# Patient Record
Sex: Male | Born: 1970 | Race: White | Hispanic: No | Marital: Married | Smoking: Never smoker
Health system: Southern US, Community
[De-identification: ages and names within clinical notes are randomized; demographics above are authoritative.]

## PROBLEM LIST (undated history)

## (undated) DIAGNOSIS — N289 Disorder of kidney and ureter, unspecified: Secondary | ICD-10-CM

## (undated) DIAGNOSIS — R011 Cardiac murmur, unspecified: Secondary | ICD-10-CM

## (undated) DIAGNOSIS — K219 Gastro-esophageal reflux disease without esophagitis: Secondary | ICD-10-CM

## (undated) DIAGNOSIS — T7840XA Allergy, unspecified, initial encounter: Secondary | ICD-10-CM

## (undated) HISTORY — PX: OTHER SURGICAL HISTORY: SHX169

## (undated) HISTORY — DX: Gastro-esophageal reflux disease without esophagitis: K21.9

## (undated) HISTORY — DX: Cardiac murmur, unspecified: R01.1

## (undated) HISTORY — DX: Allergy, unspecified, initial encounter: T78.40XA

---

## 1997-09-08 ENCOUNTER — Emergency Department (HOSPITAL_COMMUNITY): Admission: EM | Admit: 1997-09-08 | Discharge: 1997-09-08 | Payer: Self-pay | Admitting: Emergency Medicine

## 1999-11-10 ENCOUNTER — Emergency Department (HOSPITAL_COMMUNITY): Admission: EM | Admit: 1999-11-10 | Discharge: 1999-11-10 | Payer: Self-pay | Admitting: Emergency Medicine

## 1999-11-10 ENCOUNTER — Encounter: Payer: Self-pay | Admitting: Emergency Medicine

## 2001-08-09 ENCOUNTER — Encounter: Payer: Self-pay | Admitting: Gastroenterology

## 2001-08-09 ENCOUNTER — Encounter: Admission: RE | Admit: 2001-08-09 | Discharge: 2001-08-09 | Payer: Self-pay | Admitting: Gastroenterology

## 2004-02-09 HISTORY — PX: OTHER SURGICAL HISTORY: SHX169

## 2004-05-16 ENCOUNTER — Ambulatory Visit (HOSPITAL_COMMUNITY): Admission: RE | Admit: 2004-05-16 | Discharge: 2004-05-16 | Payer: Self-pay | Admitting: Urology

## 2009-09-22 ENCOUNTER — Ambulatory Visit: Payer: Self-pay | Admitting: Cardiovascular Disease

## 2009-09-22 ENCOUNTER — Encounter (INDEPENDENT_AMBULATORY_CARE_PROVIDER_SITE_OTHER): Payer: Self-pay | Admitting: *Deleted

## 2009-09-22 ENCOUNTER — Encounter: Payer: Self-pay | Admitting: Cardiovascular Disease

## 2009-09-22 DIAGNOSIS — R0789 Other chest pain: Secondary | ICD-10-CM | POA: Insufficient documentation

## 2009-09-22 DIAGNOSIS — R011 Cardiac murmur, unspecified: Secondary | ICD-10-CM

## 2009-11-13 ENCOUNTER — Encounter: Payer: Self-pay | Admitting: Cardiovascular Disease

## 2009-11-13 ENCOUNTER — Ambulatory Visit (HOSPITAL_COMMUNITY): Admission: RE | Admit: 2009-11-13 | Discharge: 2009-11-13 | Payer: Self-pay | Admitting: Cardiovascular Disease

## 2009-11-13 ENCOUNTER — Ambulatory Visit: Payer: Self-pay

## 2009-11-13 ENCOUNTER — Ambulatory Visit: Payer: Self-pay | Admitting: Internal Medicine

## 2009-11-13 ENCOUNTER — Ambulatory Visit: Payer: Self-pay | Admitting: Cardiovascular Disease

## 2009-11-13 DIAGNOSIS — I10 Essential (primary) hypertension: Secondary | ICD-10-CM | POA: Insufficient documentation

## 2010-02-19 ENCOUNTER — Ambulatory Visit
Admission: RE | Admit: 2010-02-19 | Discharge: 2010-02-19 | Payer: Self-pay | Source: Home / Self Care | Attending: Cardiovascular Disease | Admitting: Cardiovascular Disease

## 2010-03-10 NOTE — Assessment & Plan Note (Signed)
Summary: PER CHECK OUT/ALSO ECHO @ 10:30/SAF   CC:  follow up echo.  History of Present Illness: 40 yo referred by Dr Sullivan Lone for SSCP.  Pain for 3-6 months.  Atypical can last all day.  Associated with palpitations in a.m mostly.  No CRF's and othewise healthy.  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  Recent divorce with joint custody of two children.  Always exhausted.  Does run without significatn SSCP.  No associated dyspnea or diaphoresis.  No palpitaitons latter in day.  No drugs or stimulants.  ETT done in office last time was normal.  Reviewed echo done today. Normal.  Appears to have borderline HTN with HTN response to exercise.  Encouraged him to het home BP cuff and avoid salt.  Will recheck and review home readings in 3 months  Current Problems (verified): 1)  Cardiac Murmur  (ICD-785.2) 2)  Chest Discomfort  (ICD-786.59)  Current Medications (verified): 1)  Zyrtec Allergy 10 Mg Tabs (Cetirizine Hcl) .Marland Kitchen.. 1 Tab By Mouth Once Daily 2)  Multivitamins   Tabs (Multiple Vitamin) .Marland Kitchen.. 1 Tab By Mouth 3 Days Weekly  Allergies (verified): No Known Drug Allergies  Past History:  Past Medical History: Last updated: 09/22/2009 negative for premature CAD  Past Surgical History: Last updated: 09/17/2009  Left ureteral stone, distal.  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   Vital Signs:  Patient profile:   40 year old male Height:      73 inches Weight:      189 pounds BMI:     25.03 Pulse rate:   63 / minute Resp:     12 per minute BP sitting:    122 / 79  (left arm)  Vitals Entered By: Kem Parkinson (November 13, 2009 11:22 AM)  Physical Exam  General:  Affect appropriate Healthy:  appears stated age HEENT: normal Neck supple with no adenopathy JVP normal no bruits no thyromegaly Lungs clear with no wheezing and good diaphragmatic motion Heart:  S1/S2 no murmur,rub, gallop or click PMI normal Abdomen: benighn, BS positve, no tenderness, no AAA no bruit.  No HSM or HJR Distal pulses intact with no bruits No edema Neuro non-focal Skin warm and dry    Impression & Recommendations:  Problem # 1:  CHEST DISCOMFORT (ICD-786.59) Normal ETT no further w.U  Problem # 2:  CARDIAC MURMUR (ICD-785.2) Not audible today ? flow murmur.  Echo normal  Problem # 5:  HYPERTENSION, BENIGN, TRANSIENT (ICD-401.1) HTN response to exercise over 200 systolic.  Home BP readings and F/U 3 months  No LVH on echo  Patient Instructions: 1)  Your physician recommends that you schedule a follow-up appointment in: 3 MONTHS WITH DR Eden Emms 2)  Your physician recommends that you continue on your current medications as directed. Please refer to the Current Medication list given to you today.

## 2010-03-10 NOTE — Assessment & Plan Note (Signed)
Summary: chest discomfort/mt   CC:  chest pain also rapid heart beat.  History of Present Illness: 40 yo referred by Dr Sullivan Lone for SSCP.  Pain for 3-6 months.  Atypical can last all day.  Associated with palpitations in a.m mostly.  No CRF's and othewise healthy.  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  Recent divorce with joint custody of two children.  Always exhausted.  Does run without significatn SSCP.  No associated dyspnea or diaphoresis.  No palpitaitons latter in day.  No drugs or stimulants.    Current Problems (verified): 1)  Chest Discomfort  (ICD-786.59)  Current Medications (verified): 1)  Zyrtec Allergy 10 Mg Tabs (Cetirizine Hcl) .Marland Kitchen.. 1 Tab By Mouth Once Daily 2)  Multivitamins   Tabs (Multiple Vitamin) .Marland Kitchen.. 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   Vital Signs:  Patient profile:   40 year old male Height:      73 inches Weight:      189 pounds BMI:     25.03 Pulse rate:   64 / minute Resp:     12 per minute BP sitting:   118 / 70  (left arm)  Vitals Entered By: Kem Parkinson (September 22, 2009 2:57 PM)  Physical Exam  General:  Affect appropriate Healthy:  appears stated age HEENT: normal Neck supple with no adenopathy JVP normal no bruits no thyromegaly Lungs clear with no wheezing and good diaphragmatic motion Heart:  S1/S2 no murmur,rub, gallop or click PMI normal Abdomen: benighn, BS positve, no tenderness, no AAA no bruit.   No HSM or HJR Distal pulses intact with no bruits No edema Neuro non-focal Skin warm and dry    Impression & Recommendations:  Problem # 1:  CHEST DISCOMFORT (ICD-786.59) Atypical with normal ECG.  Will do ETT today No imaging needed.   EKG Report  Procedure date:  09/22/2009  Findings:      NSR Normal ECG

## 2010-03-10 NOTE — Miscellaneous (Signed)
Summary: Orders Update  Clinical Lists Changes  Problems: Added new problem of CARDIAC MURMUR (ICD-785.2) Orders: Added new Referral order of Echocardiogram (Echo) - Signed

## 2010-03-12 NOTE — Assessment & Plan Note (Signed)
Summary: 3 MONTH ROV/SL   CC:  check up.  History of Present Illness: Phillip Choi is seen today for F/U of SSCP, borderline HTN.  Echo and ETT were normal.  He is training for a half marathon in [place] with no problems.  BP readings at home have been ok with rare readings above .  No recurrent SSCP, dyspnea, palpiatoins or syncope   Current Problems (verified): 1)  Hypertension, Benign, Transient  (ICD-401.1) 2)  Cardiac Murmur  (ICD-785.2) 3)  Chest Discomfort  (ICD-786.59)  Current Medications (verified): 1)  Zyrtec Allergy 10 Mg Tabs (Cetirizine Hcl) .Marland Kitchen.. 1 Tab By Mouth Once Daily 2)  Multivitamins   Tabs (Multiple Vitamin) .Marland Kitchen.. 1 Tab By Mouth 3 Days Weekly  Allergies (verified): No Known Drug Allergies  Past History:  Past Medical History: Last updated: 09/22/2009 negative for premature CAD  Past Surgical History: Last updated: 09/17/2009  Left ureteral stone, distal.  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   Vital Signs:  Patient profile:   40 year old male Height:      73 inches Weight:      194 pounds BMI:     25.69 Pulse rate:   68 / minute Resp:     14 per minute BP sitting:   130 / 70  (left arm)  Vitals Entered By: Kem Parkinson (February 19, 2010 8:16 AM)  Physical Exam  General:  Affect appropriate Healthy:  appears stated age HEENT: normal Neck supple with no adenopathy JVP normal no bruits no thyromegaly Lungs clear with no wheezing and good diaphragmatic motion Heart:  S1/S2 no murmur,rub, gallop or click PMI normal Abdomen: benighn, BS  positve, no tenderness, no AAA no bruit.  No HSM or HJR Distal pulses intact with no bruits No edema Neuro non-focal Skin warm and dry    Impression & Recommendations:  Problem # 1:  HYPERTENSION, BENIGN, TRANSIENT (ICD-401.1) Continiue exercise and low sodium diet.  No indicatation for medicatin at this time  Problem # 2:  CHEST DISCOMFORT (ICD-786.59) Normal echo and ETT  Active and running Observe

## 2010-06-26 NOTE — Op Note (Signed)
NAME:  Piehl, Sajan            ACCOUNT NO.:  [id number]   MEDICAL RECORD NO.:  [id number]          PATIENT TYPE:  AMB   LOCATION:  DAY                          FACILITY:  [facility]   PHYSICIAN:  Rozanna Boer., M.D.DATE OF BIRTH:  1970-11-21   DATE OF PROCEDURE:  05/16/2004  DATE OF DISCHARGE:                                 OPERATIVE REPORT   PREOPERATIVE DIAGNOSIS:  Left ureteral stone, distal.   POSTOPERATIVE DIAGNOSIS:  Left ureteral stone, distal.   OPERATION:  1.  Cysto.  2.  Left retrograde pyelogram.  3.  Ureteroscopy with stone removal.   ANESTHESIA:  General.   SURGEON:  Dr. Aldean Ast   This 40 year old patient is admitted for unrelieved pain because of a distal  left ureteral stone.  This is about 5-6 mm in size.  He had his first  episode of pain last November when he had a 4-5 mm left ureteral stone about  L1 associated with hydronephrosis on the left side and no other renal stones  seen at that time.  He had not had previous stones or hematuria.  His pain  went away but came back again later last month, and now the stone has  progressed down to the left distal ureteral orifice, and he is having  unrelieved pain and nausea and vomiting.  He enters now for ureteroscopy,  possible holmium laser treatment of the stone.   The patient was placed on the operating table in dorsolithotomy position.  After satisfactory induction of general anesthesia, was prepped and draped  with Betadine in the usual sterile fashion.  A #22 panendoscope was  inserted, no anterior stricture seen.  Short and nonobstructing prostate was  noted.  The bladder was entered and carefully inspected.  No bladder mucosal  lesions were seen.  Both orifices looked normal.   A 5 open-ended ureteral catheter was inserted in the left ureteral orifice,  and a retrograde occlusive pyelogram was performed.  This demonstrated a  filling defect in the distal left ureter corresponding to the  calcification  seen on the KUB preoperatively.  He did have some mild hydronephrosis  proximal to this.  There were no other filling defects seen within the  ureter.   A 0.038 floppy tip guidewire was then passed through the open-ended catheter  under fluoroscopy past the stone up into the level of the kidney as the open-  ended catheter was then removed.  Over the guidewire, a 4 cm ureteral  balloon dilator was then passed and under fluoroscopy the end of the balloon  was passed right up to where the stone and filling defect were seen.  This  was inflated to 12 atmospheres and kept maintained for 3 timed minutes.  When this was done, the balloon was deflated and removed, leaving the  guidewire in place.  A 6 short ureteroscope was then passed through the  urethra, up the ureter, to the stone.  I was able to negotiate past the  stone a short distance and then used a basket to entrap and remove the stone  intact.  The stone was fairly large,  and actually there was a small one with  it.  Another view of the bladder with the cystoscope showed that the  ureteral orifice was fairly well  dilated, and there was very minimal bleeding, so I decided not to leave a  stent.  I drained the bladder, removed the scope and instruments, and gave  the patient 30 mg of Toradol IV and B&O suppository and was taken to the  recovery room in good condition where he will be later discharged as an  outpatient.      HMK/MEDQ  D:  05/16/2004  T:  05/16/2004  Job:  [id number]

## 2012-03-16 ENCOUNTER — Emergency Department (HOSPITAL_COMMUNITY): Payer: 59

## 2012-03-16 ENCOUNTER — Ambulatory Visit
Admission: RE | Admit: 2012-03-16 | Discharge: 2012-03-16 | Disposition: A | Discharge status: Home / Self Care | Payer: 59 | Source: Ambulatory Visit | Attending: Gastroenterology | Admitting: Gastroenterology

## 2012-03-16 ENCOUNTER — Encounter (HOSPITAL_COMMUNITY): Payer: Self-pay | Admitting: *Deleted

## 2012-03-16 ENCOUNTER — Other Ambulatory Visit: Payer: Self-pay | Admitting: Gastroenterology

## 2012-03-16 ENCOUNTER — Inpatient Hospital Stay (HOSPITAL_COMMUNITY)
Admission: EM | Admit: 2012-03-16 | Discharge: 2012-03-18 | DRG: 337 | Disposition: A | Discharge status: Home / Self Care | Payer: 59 | Attending: General Surgery | Admitting: General Surgery

## 2012-03-16 DIAGNOSIS — R109 Unspecified abdominal pain: Secondary | ICD-10-CM

## 2012-03-16 DIAGNOSIS — Q430 Meckel's diverticulum (displaced) (hypertrophic): Secondary | ICD-10-CM

## 2012-03-16 DIAGNOSIS — K4090 Unilateral inguinal hernia, without obstruction or gangrene, not specified as recurrent: Secondary | ICD-10-CM | POA: Diagnosis present

## 2012-03-16 DIAGNOSIS — I10 Essential (primary) hypertension: Secondary | ICD-10-CM | POA: Diagnosis present

## 2012-03-16 DIAGNOSIS — R011 Cardiac murmur, unspecified: Secondary | ICD-10-CM | POA: Diagnosis present

## 2012-03-16 DIAGNOSIS — K56609 Unspecified intestinal obstruction, unspecified as to partial versus complete obstruction: Secondary | ICD-10-CM | POA: Diagnosis present

## 2012-03-16 DIAGNOSIS — K56600 Partial intestinal obstruction, unspecified as to cause: Secondary | ICD-10-CM

## 2012-03-16 DIAGNOSIS — R030 Elevated blood-pressure reading, without diagnosis of hypertension: Secondary | ICD-10-CM | POA: Diagnosis present

## 2012-03-16 DIAGNOSIS — R12 Heartburn: Secondary | ICD-10-CM | POA: Diagnosis present

## 2012-03-16 DIAGNOSIS — E860 Dehydration: Secondary | ICD-10-CM | POA: Diagnosis present

## 2012-03-16 DIAGNOSIS — Z79899 Other long term (current) drug therapy: Secondary | ICD-10-CM

## 2012-03-16 DIAGNOSIS — K565 Intestinal adhesions [bands], unspecified as to partial versus complete obstruction: Principal | ICD-10-CM | POA: Diagnosis present

## 2012-03-16 HISTORY — DX: Disorder of kidney and ureter, unspecified: N28.9

## 2012-03-16 LAB — COMPREHENSIVE METABOLIC PANEL
AST: 20 U/L (ref 0–37)
Albumin: 4.8 g/dL (ref 3.5–5.2)
BUN: 17 mg/dL (ref 6–23)
Calcium: 10.3 mg/dL (ref 8.4–10.5)
Creatinine, Ser: 1.03 mg/dL (ref 0.50–1.35)
Total Bilirubin: 0.6 mg/dL (ref 0.3–1.2)

## 2012-03-16 LAB — CBC WITH DIFFERENTIAL/PLATELET
Basophils Absolute: 0 10*3/uL (ref 0.0–0.1)
Basophils Relative: 0 % (ref 0–1)
Eosinophils Absolute: 0 10*3/uL (ref 0.0–0.7)
Eosinophils Relative: 0 % (ref 0–5)
HCT: 47.2 % (ref 39.0–52.0)
Hemoglobin: 16.8 g/dL (ref 13.0–17.0)
MCH: 32.1 pg (ref 26.0–34.0)
MCHC: 35.6 g/dL (ref 30.0–36.0)
MCV: 90.2 fL (ref 78.0–100.0)
Monocytes Absolute: 1.1 10*3/uL — ABNORMAL HIGH (ref 0.1–1.0)
Monocytes Relative: 8 % (ref 3–12)
RDW: 11.4 % — ABNORMAL LOW (ref 11.5–15.5)

## 2012-03-16 MED ORDER — ALUM & MAG HYDROXIDE-SIMETH 200-200-20 MG/5ML PO SUSP: 30.0000 mL | Freq: Four times a day (QID) | ORAL | Status: DC | PRN

## 2012-03-16 MED ORDER — IOHEXOL 300 MG/ML  SOLN
50.0000 mL | Freq: Once | INTRAMUSCULAR | Status: AC | PRN
  Administered 2012-03-16: 50 mL via ORAL

## 2012-03-16 MED ORDER — LIDOCAINE HCL 2 % EX GEL
CUTANEOUS | Status: AC
  Administered 2012-03-16: 23:00:00
  Filled 2012-03-16: qty 10

## 2012-03-16 MED ORDER — HYDROMORPHONE HCL PF 1 MG/ML IJ SOLN
0.5000 mg | INTRAMUSCULAR | Status: DC | PRN
  Administered 2012-03-17: 1 mg via INTRAVENOUS
  Filled 2012-03-16: qty 1

## 2012-03-16 MED ORDER — SODIUM CHLORIDE 0.9 % IV BOLUS (SEPSIS)
1000.0000 mL | Freq: Once | INTRAVENOUS | Status: AC
  Administered 2012-03-16: 1000 mL via INTRAVENOUS

## 2012-03-16 MED ORDER — ACETAMINOPHEN 650 MG RE SUPP: 650.0000 mg | Freq: Four times a day (QID) | RECTAL | Status: DC | PRN

## 2012-03-16 MED ORDER — MENTHOL 3 MG MT LOZG: 1.0000 | LOZENGE | OROMUCOSAL | Status: DC | PRN

## 2012-03-16 MED ORDER — PHENOL 1.4 % MT LIQD: 2.0000 | OROMUCOSAL | Status: DC | PRN

## 2012-03-16 MED ORDER — HEPARIN SODIUM (PORCINE) 5000 UNIT/ML IJ SOLN
5000.0000 [IU] | Freq: Three times a day (TID) | INTRAMUSCULAR | Status: DC
  Administered 2012-03-16 – 2012-03-18 (×4): 5000 [IU] via SUBCUTANEOUS
  Filled 2012-03-16 (×8): qty 1

## 2012-03-16 MED ORDER — METOPROLOL TARTRATE 1 MG/ML IV SOLN
5.0000 mg | Freq: Four times a day (QID) | INTRAVENOUS | Status: DC | PRN
  Filled 2012-03-16: qty 5

## 2012-03-16 MED ORDER — IOHEXOL 300 MG/ML  SOLN
100.0000 mL | Freq: Once | INTRAMUSCULAR | Status: AC | PRN
  Administered 2012-03-16: 100 mL via INTRAVENOUS

## 2012-03-16 MED ORDER — LACTATED RINGERS IV BOLUS (SEPSIS)
2000.0000 mL | Freq: Once | INTRAVENOUS | Status: AC
  Administered 2012-03-16: 2000 mL via INTRAVENOUS

## 2012-03-16 MED ORDER — BISACODYL 10 MG RE SUPP: 10.0000 mg | Freq: Two times a day (BID) | RECTAL | Status: DC | PRN

## 2012-03-16 MED ORDER — ONDANSETRON HCL 4 MG/2ML IJ SOLN
4.0000 mg | Freq: Once | INTRAMUSCULAR | Status: AC
Start: 2012-03-16 — End: 2012-03-16
  Administered 2012-03-16: 4 mg via INTRAVENOUS
  Filled 2012-03-16: qty 2

## 2012-03-16 MED ORDER — MAGIC MOUTHWASH
15.0000 mL | Freq: Four times a day (QID) | ORAL | Status: DC | PRN
  Filled 2012-03-16: qty 15

## 2012-03-16 MED ORDER — FAMOTIDINE IN NACL 20-0.9 MG/50ML-% IV SOLN
20.0000 mg | Freq: Two times a day (BID) | INTRAVENOUS | Status: DC
  Administered 2012-03-17 – 2012-03-18 (×3): 20 mg via INTRAVENOUS
  Filled 2012-03-16 (×5): qty 50

## 2012-03-16 MED ORDER — ONDANSETRON HCL 4 MG/2ML IJ SOLN
4.0000 mg | Freq: Once | INTRAMUSCULAR | Status: AC
  Administered 2012-03-16: 4 mg via INTRAVENOUS
  Filled 2012-03-16: qty 2

## 2012-03-16 MED ORDER — PROMETHAZINE HCL 25 MG/ML IJ SOLN
12.5000 mg | Freq: Four times a day (QID) | INTRAMUSCULAR | Status: DC | PRN
  Filled 2012-03-16: qty 1

## 2012-03-16 MED ORDER — DIPHENHYDRAMINE HCL 50 MG/ML IJ SOLN: 12.5000 mg | Freq: Four times a day (QID) | INTRAMUSCULAR | Status: DC | PRN

## 2012-03-16 MED ORDER — LIP MEDEX EX OINT
1.0000 "application " | TOPICAL_OINTMENT | Freq: Two times a day (BID) | CUTANEOUS | Status: DC
  Administered 2012-03-17 – 2012-03-18 (×3): 1 via TOPICAL
  Filled 2012-03-16: qty 7

## 2012-03-16 MED ORDER — LACTATED RINGERS IV BOLUS (SEPSIS)
1000.0000 mL | Freq: Three times a day (TID) | INTRAVENOUS | Status: DC | PRN
  Administered 2012-03-17: 1000 mL via INTRAVENOUS

## 2012-03-16 MED ORDER — ACETAMINOPHEN 325 MG PO TABS: 650.0000 mg | ORAL_TABLET | Freq: Four times a day (QID) | ORAL | Status: DC | PRN

## 2012-03-16 MED ORDER — ONDANSETRON HCL 4 MG/2ML IJ SOLN: 4.0000 mg | Freq: Four times a day (QID) | INTRAMUSCULAR | Status: DC | PRN

## 2012-03-16 MED ORDER — HYDROMORPHONE HCL PF 1 MG/ML IJ SOLN
0.5000 mg | Freq: Once | INTRAMUSCULAR | Status: AC
  Administered 2012-03-16: 0.5 mg via INTRAVENOUS
  Filled 2012-03-16: qty 1

## 2012-03-16 MED ORDER — KCL IN DEXTROSE-NACL 40-5-0.9 MEQ/L-%-% IV SOLN
INTRAVENOUS | Status: DC
  Administered 2012-03-17 (×2): via INTRAVENOUS
  Filled 2012-03-16 (×4): qty 1000

## 2012-03-16 MED ORDER — DIPHENHYDRAMINE HCL 12.5 MG/5ML PO ELIX: 12.5000 mg | ORAL_SOLUTION | Freq: Four times a day (QID) | ORAL | Status: DC | PRN

## 2012-03-16 NOTE — ED Notes (Signed)
Surgeon at bedside.  

## 2012-03-16 NOTE — ED Notes (Signed)
Hospitalist at bedside 

## 2012-03-16 NOTE — ED Provider Notes (Signed)
History     CSN: [id number]  Arrival date & time 03/16/12  1722   First MD Initiated Contact with Patient 03/16/12 1817      Chief Complaint  Patient presents with  . small bowel obstruction     (Consider location/radiation/quality/duration/timing/severity/associated sxs/prior treatment) HPI  Phillip Choi is a 42 y.o. male complaining of subjective fever and moderate to severe abdominal pain onset last night.  Patient states he is not passing gas.  He stated he has had 2-3 episodes of watery diarrhea this after you to noon with to 3 episodes of nonbloody nonbilious, not coffee-ground emesis this afternoon as well.  He said he's had approximately 10 similar episodes in the past 15 years he seen a gastroenterologist, Dr. Randa Evens working at [place].  Who advised him to present to the emergency room during symptom exacerbation so it can be done at that time.  Patient denies any chest pain, shortness of breath, dizzy sensation change in bladder habits, blood in stool or emesis.  No prior abdominal surgeries.   Past Medical History  Diagnosis Date  . Renal disorder     hx of kidney stone    Past Surgical History  Procedure Date  . Gum graft     Feb 2014    History reviewed. No pertinent family history.  History  Substance Use Topics  . Smoking status: Never Smoker   . Smokeless tobacco: Not on file  . Alcohol Use: Yes     Comment: 12 drinks/ week      Review of Systems  Constitutional: Negative for fever.  Respiratory: Negative for shortness of breath.   Cardiovascular: Negative for chest pain.  Gastrointestinal: Positive for nausea, vomiting, abdominal pain, diarrhea and abdominal distention.  All other systems reviewed and are negative.    Allergies  Review of patient's allergies indicates no known allergies.  Home Medications   Current Outpatient Rx  Name  Route  Sig  Dispense  Refill  . NAPROXEN 250 MG PO TABS   Oral   Take 250 mg by mouth as needed. PAIN         . ONDANSETRON HCL 4 MG PO TABS   Oral   Take 4 mg by mouth every 8 (eight) hours as needed. NAUSEA         . PENICILLIN V POTASSIUM 500 MG PO TABS   Oral   Take 500 mg by mouth 3 (three) times daily.         Marland Kitchen PSEUDOEPHEDRINE-GUAIFENESIN ER 60-600 MG PO TB12   Oral   Take 1 tablet by mouth as needed. CONGESTION           BP 124/88  Pulse 77  Temp 97.8 F (36.6 C) (Oral)  Resp 16  SpO2 100%  Physical Exam  Nursing note and vitals reviewed. Constitutional: He is oriented to person, place, and time. He appears well-developed and well-nourished. No distress.  HENT:  Head: Normocephalic.  Mouth/Throat: Oropharynx is clear and moist.  Eyes: Conjunctivae normal and EOM are normal. Pupils are equal, round, and reactive to light.  Neck: Normal range of motion.  Cardiovascular: Normal rate, regular rhythm and intact distal pulses.   Pulmonary/Chest: Effort normal and breath sounds normal. No stridor. No respiratory distress. He has no wheezes. He has no rales. He exhibits no tenderness.  Abdominal: Soft. He exhibits no distension and no mass. There is no tenderness. There is no rebound and no guarding.       Mild distention.  Upper quadrant bowel sounds are hyperactive.  Diffusely tender to deep palpation.  No guarding or rebound.  Musculoskeletal: Normal range of motion.  Neurological: He is alert and oriented to person, place, and time.  Psychiatric: He has a normal mood and affect.    ED Course  Procedures (including critical care time)  Labs Reviewed - No data to display Dg Abd 2 Views  03/16/2012  *RADIOLOGY REPORT*  Clinical Data: Abdominal pain, nausea and vomiting  ABDOMEN - 2 VIEW  Comparison: 05/16/2004; abdominal CT - 05/13/2004  Findings:  There is moderate gaseous distension of multiple loops of small bowel with index loop within the right upper abdominal quadrant measuring approximately 4.6 cm in diameter.  This finding is associated with several air  fluid levels of differing heights on the  provided upright abdominal radiograph.  There is a conspicuous paucity of distal colonic gas.  No definite pneumoperitoneum, pneumatosis or portal venous gas.  No definite abnormal intra-abdominal calcifications.  Limited visualization lower thorax normal.  No acute osseous abnormalities.  IMPRESSION: Findings worrisome for small bowel obstruction.  Further evaluation of abdominal CT may be performed as clinically indicated.   Original Report Authenticated By: Tacey Ruiz, MD      No diagnosis found.    MDM  Patient with no risk factors for small bowel obstruction percent in with abdominal pain, distention, nausea vomiting and several episodes of diarrhea this afternoon.  X-ray is very concerning for SBO with distended loops in the right upper quadrant and air fluid levels.  Patient is a mild leukocytosis of 12.5.  Case signed out to PA Greene at shift change.        Wynetta Emery, PA-C 03/16/12 2018

## 2012-03-16 NOTE — ED Notes (Signed)
Unable to insert 18 Fr NG tube for pt care. 2nd RN to try and unable. Reattempted insertion with 16 Fr and 14 Fr and unsuccessful.

## 2012-03-16 NOTE — ED Provider Notes (Signed)
Pt signed out to me by Wynetta Emery, PA-C.    Concerns for SBO. CT scan shows partial small bowel. Pt not actively vomiting--- [facility] has agreed to admit but requests that surgery consult on patient. Will call and request GenSurg consult.  Results for orders placed during the hospital encounter of 03/16/12  CBC WITH DIFFERENTIAL      Component Value Range   WBC 12.5 (*) 4.0 - 10.5 K/uL   RBC 5.23  4.22 - 5.81 MIL/uL   Hemoglobin 16.8  13.0 - 17.0 g/dL   HCT 16.1  09.6 - 04.5 %   MCV 90.2  78.0 - 100.0 fL   MCH 32.1  26.0 - 34.0 pg   MCHC 35.6  30.0 - 36.0 g/dL   RDW 40.9 (*) 81.1 - 91.4 %   Platelets 284  150 - 400 K/uL   Neutrophils Relative 82 (*) 43 - 77 %   Neutro Abs 10.3 (*) 1.7 - 7.7 K/uL   Lymphocytes Relative 9 (*) 12 - 46 %   Lymphs Abs 1.1  0.7 - 4.0 K/uL   Monocytes Relative 8  3 - 12 %   Monocytes Absolute 1.1 (*) 0.1 - 1.0 K/uL   Eosinophils Relative 0  0 - 5 %   Eosinophils Absolute 0.0  0.0 - 0.7 K/uL   Basophils Relative 0  0 - 1 %   Basophils Absolute 0.0  0.0 - 0.1 K/uL  COMPREHENSIVE METABOLIC PANEL      Component Value Range   Sodium 136  135 - 145 mEq/L   Potassium 4.0  3.5 - 5.1 mEq/L   Chloride 97  96 - 112 mEq/L   CO2 25  19 - 32 mEq/L   Glucose, Bld 106 (*) 70 - 99 mg/dL   BUN 17  6 - 23 mg/dL   Creatinine, Ser 7.82  0.50 - 1.35 mg/dL   Calcium 95.6  8.4 - 21.3 mg/dL   Total Protein 9.1 (*) 6.0 - 8.3 g/dL   Albumin 4.8  3.5 - 5.2 g/dL   AST 20  0 - 37 U/L   ALT 17  0 - 53 U/L   Alkaline Phosphatase 91  39 - 117 U/L   Total Bilirubin 0.6  0.3 - 1.2 mg/dL   GFR calc non Af Amer 90 (*) >90 mL/min   GFR calc Af Amer >90  >90 mL/min   Ct Abdomen Pelvis W Contrast  03/16/2012  *RADIOLOGY REPORT*  Clinical Data: Abdominal pain for 2 days with vomiting today. Current radiographs show evidence of a small bowel obstruction.  CT ABDOMEN AND PELVIS WITH CONTRAST  Technique:  Multidetector CT imaging of the abdomen and pelvis was performed following the  standard protocol during bolus administration of intravenous contrast.  Contrast: 50mL OMNIPAQUE IOHEXOL 300 MG/ML  SOLN, OMNIPAQUE IOHEXOL 300 MG/ML  SOLN  Comparison: Current abdomen radiographs and prior abdomen CT, 05/13/2004  Findings: There are findings of a partial small bowel obstruction. The transition point appears to be in the right upper quadrant in the proximal to mid ileum.  The jejunum is dilated diffusely with a maximal dilation measured at 4 cm.  There is no bowel wall dilation.  There is no mesenteric inflammation.  The colon is mostly decompressed.  A normal appendix is visualized.  The liver, spleen, gallbladder and pancreas are unremarkable. There is no bile duct dilation.  Normal adrenal glands. There is a 1 ml nonobstructing stone in the lower pole of the left  kidney. Kidneys are otherwise unremarkable.  Normal ureters.  Normal bladder.  No pathologically enlarged lymph nodes.  No abnormal fluid collections.  There are mild degenerative changes of the visualized spine most prominent at L5-S1.  No osteoblastic or osteolytic lesions.  IMPRESSION: Partial small bowel obstruction with the transition point in the right upper quadrant.  No evidence of bowel inflammation or ischemia.  No other acute findings.   Original Report Authenticated By: Amie Portland, M.D.    Dg Abd 2 Views  03/16/2012  *RADIOLOGY REPORT*  Clinical Data: Abdominal pain, nausea and vomiting  ABDOMEN - 2 VIEW  Comparison: 05/16/2004; abdominal CT - 05/13/2004  Findings:  There is moderate gaseous distension of multiple loops of small bowel with index loop within the right upper abdominal quadrant measuring approximately 4.6 cm in diameter.  This finding is associated with several air fluid levels of differing heights on the  provided upright abdominal radiograph.  There is a conspicuous paucity of distal colonic gas.  No definite pneumoperitoneum, pneumatosis or portal venous gas.  No definite abnormal intra-abdominal  calcifications.  Limited visualization lower thorax normal.  No acute osseous abnormalities.  IMPRESSION: Findings worrisome for small bowel obstruction.  Further evaluation of abdominal CT may be performed as clinically indicated.   Original Report Authenticated By: Tacey Ruiz, MD    ' Dr. Michaell Cowing with General Surgery to admit  Dx: partial small bowel obstruction  Dorthula Matas, PA 03/16/12 2107  Dorthula Matas, PA 03/16/12 2123

## 2012-03-16 NOTE — ED Notes (Signed)
PA at bedside.

## 2012-03-16 NOTE — H&P (Signed)
Phillip Choi  [birth year] [id number]   This patient is a 42 y.o.male who presents today for surgical evaluation at the request of Dr. Meyer Russel GI, and Dr Verne Spurr of [facility] emergency department.   Reason for evaluation: Small bowel obstruction.  Pleasant active male.  History of intermittent nausea and vomiting and abdominal pain.  Workup negative in the past although not been too comprehensive yet.  Usually presents to his gastroenterologist, Dr. Randa Evens, after the symptoms have abated.   Underwent health examination at [facility].  Discussion was made about considering colonoscopy.    Patient developed severe abdominal pain yesterday.  Progressed with nausea and vomiting.  Feels very distended.  Called his gastroenterologist.  Dr. Randa Evens recommended urgent CT scan to see if he had evidence of obstruction or other abnormality during symptoms.  No personal nor family history of GI/colon cancer, inflammatory bowel disease, irritable bowel syndrome, allergy such as Celiac Sprue, dietary/dairy problems, colitis, ulcers nor gastritis.  No recent sick contacts/gastroenteritis.  No travel outside the country.  No changes in diet.  He comes today with a male friend.He does have mild heartburn which is easily controlled with a Pepcid.  This does not feel like this.  No dysphagia to solids nor liquids.  Rare alcohol intake.  No hepatitis or pancreatitis.  No sprain/trauama.  No postprandial nausea vomiting/biliary colic.  No episodes of jaundice.  No history of trauma or fall    Past Medical History  Diagnosis Date  . Renal disorder     hx of kidney stone    Past Surgical History  Procedure Date  . Gum graft     Feb 2014  . Ureteroscopy with stone removal 2006    Dr. Aldean Ast: [facility]    History   Social History  . Marital Status: Single    Spouse Name: N/A    Number of Children: N/A  . Years of Education: N/A   Occupational History  . Not on  file.   Social History Main Topics  . Smoking status: Never Smoker   . Smokeless tobacco: Not on file  . Alcohol Use: Yes     Comment: 12 drinks/ week  . Drug Use: No  . Sexually Active:    Other Topics Concern  . Not on file   Social History Narrative  . No narrative on file    History reviewed. No pertinent family history.  Current Facility-Administered Medications  Medication Dose Route Frequency Provider Last Rate Last Dose  . acetaminophen (TYLENOL) tablet 650 mg  650 mg Oral Q6H PRN Ardeth Sportsman, MD       Or  . acetaminophen (TYLENOL) suppository 650 mg  650 mg Rectal Q6H PRN Ardeth Sportsman, MD      . alum & mag hydroxide-simeth (MAALOX/MYLANTA) 200-200-20 MG/5ML suspension 30 mL  30 mL Oral Q6H PRN Ardeth Sportsman, MD      . bisacodyl (DULCOLAX) suppository 10 mg  10 mg Rectal Q12H PRN Ardeth Sportsman, MD      . dextrose 5 % and 0.9 % NaCl with KCl 40 mEq/L infusion   Intravenous Continuous Ardeth Sportsman, MD      . diphenhydrAMINE (BENADRYL) injection 12.5-25 mg  12.5-25 mg Intravenous Q6H PRN Ardeth Sportsman, MD       Or  . diphenhydrAMINE (BENADRYL) 12.5 MG/5ML elixir 12.5-25 mg  12.5-25 mg Oral Q6H PRN Ardeth Sportsman, MD      . heparin injection 5,000  Units  5,000 Units Subcutaneous Q8H Ardeth Sportsman, MD      . HYDROmorphone (DILAUDID) injection 0.5-2 mg  0.5-2 mg Intravenous Q2H PRN Ardeth Sportsman, MD      . lactated ringers bolus 1,000 mL  1,000 mL Intravenous Q8H PRN Ardeth Sportsman, MD      . lactated ringers bolus 2,000 mL  2,000 mL Intravenous Once Ardeth Sportsman, MD      . lip balm (CARMEX) ointment 1 application  1 application Topical BID Ardeth Sportsman, MD      . magic mouthwash  15 mL Oral QID PRN Ardeth Sportsman, MD      . menthol-cetylpyridinium (CEPACOL) lozenge 3 mg  1 lozenge Oral PRN Ardeth Sportsman, MD      . metoprolol (LOPRESSOR) injection 5 mg  5 mg Intravenous Q6H PRN Ardeth Sportsman, MD      . ondansetron (ZOFRAN) injection 4 mg  4 mg  Intravenous Q6H PRN Ardeth Sportsman, MD      . phenol (CHLORASEPTIC) mouth spray 2 spray  2 spray Mouth/Throat PRN Ardeth Sportsman, MD      . promethazine (PHENERGAN) injection 12.5-25 mg  12.5-25 mg Intravenous Q6H PRN Ardeth Sportsman, MD       Current Outpatient Prescriptions  Medication Sig Dispense Refill  . naproxen (NAPROSYN) 250 MG tablet Take 250 mg by mouth as needed. PAIN      . ondansetron (ZOFRAN) 4 MG tablet Take 4 mg by mouth every 8 (eight) hours as needed. NAUSEA      . penicillin v potassium (VEETID) 500 MG tablet Take 500 mg by mouth 3 (three) times daily.      . pseudoephedrine-guaifenesin (MUCINEX D) 60-600 MG per tablet Take 1 tablet by mouth as needed. CONGESTION         No Known Allergies  ROS: Constitutional:  No fevers, chills, sweats.  Weight stable Eyes:  No vision changes, No discharge HENT:  No sore throats, nasal drainage Lymph: No neck swelling, No bruising easily Pulmonary:  No cough, productive sputum CV: No orthopnea, PND  Patient walks 3 hours minutes for about 10 miles without difficulty.  No exertional chest/neck/shoulder/arm pain. GI: No personal nor family history of GI/colon cancer, inflammatory bowel disease, irritable bowel syndrome, allergy such as Celiac Sprue, dietary/dairy problems, colitis, ulcers nor gastritis.  No recent sick contacts/gastroenteritis.  No travel outside the country.  No changes in diet. Renal: No UTIs, No hematuria Genital:  No drainage, bleeding, masses Musculoskeletal: No severe joint pain.  Good ROM major joints Skin:  No sores or lesions.  No rashes Heme/Lymph:  No easy bleeding.  No swollen lymph nodes Neuro: No focal weakness/numbness.  No seizures Psych: No suicidal ideation.  No hallucinations  BP 124/88  Pulse 77  Temp 97.8 F (36.6 C) (Oral)  Resp 16  SpO2 100%  Physical Exam: General: Pt awake/alert/oriented x4 in mild acute distress Eyes: PERRL, normal EOM. Sclera nonicteric Neuro: CN II-XII intact  w/o focal sensory/motor deficits. Lymph: No head/neck/groin lymphadenopathy Psych:  No delerium/psychosis/paranoia HENT: Normocephalic, Mucus membranes moist.  No thrush Neck: Supple, No tracheal deviation Chest: No pain.  Good respiratory excursion. CV:  Pulses intact.  Regular rhythm Abdomen: Soft, Moderately distended.  Mildly tender.  No guarding, rebound tenderness, Murphy's sign.  No tenderness at McBurney's point.  No umbilical hernia.  No abdominal incisions GU:  Normal external male genitalia.  Mild laxity in both groins but no definite  hernias.   Ext:  SCDs BLE.  No significant edema.  No cyanosis Skin: No petechiae / purpurea.  No major sores Musculoskeletal: No severe joint pain.  Good ROM major joints   Results:   Labs: Results for orders placed during the hospital encounter of 03/16/12 (from the past 48 hour(s))  CBC WITH DIFFERENTIAL     Status: Abnormal   Collection Time   03/16/12  6:51 PM      Component Value Range Comment   WBC 12.5 (*) 4.0 - 10.5 K/uL    RBC 5.23  4.22 - 5.81 MIL/uL    Hemoglobin 16.8  13.0 - 17.0 g/dL    HCT 16.1  09.6 - 04.5 %    MCV 90.2  78.0 - 100.0 fL    MCH 32.1  26.0 - 34.0 pg    MCHC 35.6  30.0 - 36.0 g/dL    RDW 40.9 (*) 81.1 - 15.5 %    Platelets 284  150 - 400 K/uL    Neutrophils Relative 82 (*) 43 - 77 %    Neutro Abs 10.3 (*) 1.7 - 7.7 K/uL    Lymphocytes Relative 9 (*) 12 - 46 %    Lymphs Abs 1.1  0.7 - 4.0 K/uL    Monocytes Relative 8  3 - 12 %    Monocytes Absolute 1.1 (*) 0.1 - 1.0 K/uL    Eosinophils Relative 0  0 - 5 %    Eosinophils Absolute 0.0  0.0 - 0.7 K/uL    Basophils Relative 0  0 - 1 %    Basophils Absolute 0.0  0.0 - 0.1 K/uL   COMPREHENSIVE METABOLIC PANEL     Status: Abnormal   Collection Time   03/16/12  6:51 PM      Component Value Range Comment   Sodium 136  135 - 145 mEq/L    Potassium 4.0  3.5 - 5.1 mEq/L    Chloride 97  96 - 112 mEq/L    CO2 25  19 - 32 mEq/L    Glucose, Bld 106 (*) 70 - 99 mg/dL     BUN 17  6 - 23 mg/dL    Creatinine, Ser 9.14  0.50 - 1.35 mg/dL    Calcium 78.2  8.4 - 10.5 mg/dL    Total Protein 9.1 (*) 6.0 - 8.3 g/dL    Albumin 4.8  3.5 - 5.2 g/dL    AST 20  0 - 37 U/L    ALT 17  0 - 53 U/L    Alkaline Phosphatase 91  39 - 117 U/L    Total Bilirubin 0.6  0.3 - 1.2 mg/dL    GFR calc non Af Amer 90 (*) >90 mL/min    GFR calc Af Amer >90  >90 mL/min     Imaging / Studies: Ct Abdomen Pelvis W Contrast  03/16/2012  *RADIOLOGY REPORT*  Clinical Data: Abdominal pain for 2 days with vomiting today. Current radiographs show evidence of a small bowel obstruction.  CT ABDOMEN AND PELVIS WITH CONTRAST  Technique:  Multidetector CT imaging of the abdomen and pelvis was performed following the standard protocol during bolus administration of intravenous contrast.  Contrast: 50mL OMNIPAQUE IOHEXOL 300 MG/ML  SOLN, OMNIPAQUE IOHEXOL 300 MG/ML  SOLN  Comparison: Current abdomen radiographs and prior abdomen CT, 05/13/2004  Findings: There are findings of a partial small bowel obstruction. The transition point appears to be in the right upper quadrant in the proximal to mid  ileum.  The jejunum is dilated diffusely with a maximal dilation measured at 4 cm.  There is no bowel wall dilation.  There is no mesenteric inflammation.  The colon is mostly decompressed.  A normal appendix is visualized.  The liver, spleen, gallbladder and pancreas are unremarkable. There is no bile duct dilation.  Normal adrenal glands. There is a 1 ml nonobstructing stone in the lower pole of the left kidney. Kidneys are otherwise unremarkable.  Normal ureters.  Normal bladder.  No pathologically enlarged lymph nodes.  No abnormal fluid collections.  There are mild degenerative changes of the visualized spine most prominent at L5-S1.  No osteoblastic or osteolytic lesions.  IMPRESSION: Partial small bowel obstruction with the transition point in the right upper quadrant.  No evidence of bowel inflammation or  ischemia.  No other acute findings.   Original Report Authenticated By: Amie Portland, M.D.    Dg Abd 2 Views  03/16/2012  *RADIOLOGY REPORT*  Clinical Data: Abdominal pain, nausea and vomiting  ABDOMEN - 2 VIEW  Comparison: 05/16/2004; abdominal CT - 05/13/2004  Findings:  There is moderate gaseous distension of multiple loops of small bowel with index loop within the right upper abdominal quadrant measuring approximately 4.6 cm in diameter.  This finding is associated with several air fluid levels of differing heights on the  provided upright abdominal radiograph.  There is a conspicuous paucity of distal colonic gas.  No definite pneumoperitoneum, pneumatosis or portal venous gas.  No definite abnormal intra-abdominal calcifications.  Limited visualization lower thorax normal.  No acute osseous abnormalities.  IMPRESSION: Findings worrisome for small bowel obstruction.  Further evaluation of abdominal CT may be performed as clinically indicated.   Original Report Authenticated By: Tacey Ruiz, MD     Medications / Allergies: per chart  Antibiotics: Anti-infectives    None      Assessment  Phillip Choi  42 y.o. male       Problem List:  Principal Problem:  *SBO (small bowel obstruction) Active Problems:  HYPERTENSION, BENIGN, TRANSIENT  Heartburn   Nausea vomiting abdominal pain with evidence of bowel obstruction.  Transition zone and right upper quadrant.  Negative history of surgeries in the past  Plan:  Admit  Aggressive IV fluid resuscitation for probable dehydration.  Nasogastric tube decompression  Probable diagnostic laparoscopy/lysis adhesions during this hospital admission given recurrent episodes of symptoms without any other etiology, I wonder if this is a simple band vs. Ruling out some other etiology.  He would do better with hydration and nasogastric tube decompression at least overnight.  If rapidly resolves, could transition to outpatient workup.  Will  discuss with [facility] partners.  The patient is stable.  There is no evidence of peritonitis, acute abdomen, nor shock.  There is no strong evidence of decline with current non-operative management.  There is no need for surgery at the present moment.  We will continue to follow.  -VTE prophylaxis- SCDs, etc  -mobilize as tolerated to help recovery    Ardeth Sportsman, M.D., F.A.C.S. Gastrointestinal and Minimally Invasive Surgery [facility] [address] [place], [place] [address] [phone] Main / Paging [phone] Voice Mail   03/16/2012  CARE TEAM:  PCP: No primary provider on file.  Outpatient Care Team: Patient Care Team: Vertell Novak., MD as Consulting Physician (Gastroenterology) Wendall Stade, MD as Consulting Physician (Cardiology)  Inpatient Treatment Team: Treatment Team: Attending Provider: Laray Anger, DO; Physician Assistant:  Joni Reining Pisciotta, PA-C; Technician: Satira Anis, NT; Technician: Meryl Dare, NT; Registered Nurse: Burnard Bunting, RN; Physician Assistant: Dorthula Matas, PA; Consulting Physician: Bishop Limbo, MD; Rounding Team: Bishop Limbo, MD

## 2012-03-16 NOTE — ED Notes (Signed)
Pt reports abdominal pain x2 days 5/10. Pt reports vomiting x3 today. DG showed possible bowel obstruction, sent by pcp to ED for evaluation.

## 2012-03-17 ENCOUNTER — Inpatient Hospital Stay (HOSPITAL_COMMUNITY): Payer: 59 | Admitting: Certified Registered Nurse Anesthetist

## 2012-03-17 ENCOUNTER — Encounter (HOSPITAL_COMMUNITY): Payer: Self-pay

## 2012-03-17 ENCOUNTER — Encounter (HOSPITAL_COMMUNITY): Admission: EM | Disposition: A | Discharge status: Home / Self Care | Payer: Self-pay | Source: Home / Self Care

## 2012-03-17 ENCOUNTER — Inpatient Hospital Stay (HOSPITAL_COMMUNITY): Payer: 59

## 2012-03-17 ENCOUNTER — Encounter (HOSPITAL_COMMUNITY): Payer: Self-pay | Admitting: Certified Registered Nurse Anesthetist

## 2012-03-17 DIAGNOSIS — K565 Intestinal adhesions [bands], unspecified as to partial versus complete obstruction: Secondary | ICD-10-CM

## 2012-03-17 DIAGNOSIS — Q430 Meckel's diverticulum (displaced) (hypertrophic): Secondary | ICD-10-CM

## 2012-03-17 HISTORY — PX: LAPAROSCOPY: SHX197

## 2012-03-17 LAB — BASIC METABOLIC PANEL
BUN: 17 mg/dL (ref 6–23)
Calcium: 8.5 mg/dL (ref 8.4–10.5)
GFR calc Af Amer: >90 mL/min (ref >90)
GFR calc non Af Amer: 87 mL/min — ABNORMAL LOW (ref >90)
Glucose, Bld: 124 mg/dL — ABNORMAL HIGH (ref 70–99)
Sodium: 137 mEq/L (ref 135–145)

## 2012-03-17 LAB — CBC
MCH: 31.4 pg (ref 26.0–34.0)
MCHC: 34.3 g/dL (ref 30.0–36.0)
Platelets: 233 10*3/uL (ref 150–400)

## 2012-03-17 SURGERY — LAPAROSCOPY, DIAGNOSTIC
Anesthesia: General | Site: Abdomen | Wound class: Clean

## 2012-03-17 MED ORDER — CEFAZOLIN SODIUM-DEXTROSE 2-3 GM-% IV SOLR
INTRAVENOUS | Status: DC | PRN
  Administered 2012-03-17: 2 g via INTRAVENOUS

## 2012-03-17 MED ORDER — OXYCODONE HCL 5 MG PO TABS: 5.0000 mg | ORAL_TABLET | Freq: Once | ORAL | Status: DC | PRN

## 2012-03-17 MED ORDER — ONDANSETRON HCL 4 MG/2ML IJ SOLN
INTRAMUSCULAR | Status: DC | PRN
  Administered 2012-03-17: 4 mg via INTRAVENOUS

## 2012-03-17 MED ORDER — DEXAMETHASONE SODIUM PHOSPHATE 10 MG/ML IJ SOLN
INTRAMUSCULAR | Status: DC | PRN
  Administered 2012-03-17: 10 mg via INTRAVENOUS

## 2012-03-17 MED ORDER — FENTANYL CITRATE 0.05 MG/ML IJ SOLN
INTRAMUSCULAR | Status: DC | PRN
  Administered 2012-03-17 (×4): 50 ug via INTRAVENOUS

## 2012-03-17 MED ORDER — CEFAZOLIN SODIUM-DEXTROSE 2-3 GM-% IV SOLR
INTRAVENOUS | Status: AC
  Filled 2012-03-17: qty 50

## 2012-03-17 MED ORDER — LACTATED RINGERS IV SOLN: INTRAVENOUS | Status: DC

## 2012-03-17 MED ORDER — KETOROLAC TROMETHAMINE 30 MG/ML IJ SOLN
INTRAMUSCULAR | Status: DC | PRN
  Administered 2012-03-17: 30 mg via INTRAVENOUS

## 2012-03-17 MED ORDER — ACETAMINOPHEN 10 MG/ML IV SOLN
INTRAVENOUS | Status: DC | PRN
  Administered 2012-03-17: 1000 mg via INTRAVENOUS

## 2012-03-17 MED ORDER — METOCLOPRAMIDE HCL 5 MG/ML IJ SOLN: 10.0000 mg | Freq: Once | INTRAMUSCULAR | Status: DC | PRN

## 2012-03-17 MED ORDER — PROPOFOL 10 MG/ML IV BOLUS
INTRAVENOUS | Status: DC | PRN
  Administered 2012-03-17: 150 mg via INTRAVENOUS

## 2012-03-17 MED ORDER — POTASSIUM CHLORIDE 2 MEQ/ML IV SOLN
INTRAVENOUS | Status: DC
  Administered 2012-03-18: 04:00:00 via INTRAVENOUS
  Filled 2012-03-17 (×4): qty 1000

## 2012-03-17 MED ORDER — NEOSTIGMINE METHYLSULFATE 1 MG/ML IJ SOLN
INTRAMUSCULAR | Status: DC | PRN
  Administered 2012-03-17: 5 mg via INTRAVENOUS

## 2012-03-17 MED ORDER — CEFAZOLIN SODIUM-DEXTROSE 2-3 GM-% IV SOLR: 2.0000 g | INTRAVENOUS | Status: DC

## 2012-03-17 MED ORDER — ROCURONIUM BROMIDE 100 MG/10ML IV SOLN
INTRAVENOUS | Status: DC | PRN
  Administered 2012-03-17: 10 mg via INTRAVENOUS
  Administered 2012-03-17: 30 mg via INTRAVENOUS

## 2012-03-17 MED ORDER — OXYCODONE HCL 5 MG/5ML PO SOLN
5.0000 mg | Freq: Once | ORAL | Status: DC | PRN
  Filled 2012-03-17: qty 5

## 2012-03-17 MED ORDER — BUPIVACAINE-EPINEPHRINE 0.25% -1:200000 IJ SOLN
INTRAMUSCULAR | Status: DC | PRN
  Administered 2012-03-17: 20 mL

## 2012-03-17 MED ORDER — IOHEXOL 300 MG/ML  SOLN: 50.0000 mL | Freq: Once | INTRAMUSCULAR | Status: AC | PRN

## 2012-03-17 MED ORDER — GLYCOPYRROLATE 0.2 MG/ML IJ SOLN
INTRAMUSCULAR | Status: DC | PRN
  Administered 2012-03-17: 0.6 mg via INTRAVENOUS

## 2012-03-17 MED ORDER — LIDOCAINE HCL (CARDIAC) 20 MG/ML IV SOLN
INTRAVENOUS | Status: DC | PRN
  Administered 2012-03-17: 100 mg via INTRAVENOUS

## 2012-03-17 MED ORDER — HYDROMORPHONE HCL PF 1 MG/ML IJ SOLN: 0.2500 mg | INTRAMUSCULAR | Status: DC | PRN

## 2012-03-17 MED ORDER — ACETAMINOPHEN 10 MG/ML IV SOLN
INTRAVENOUS | Status: AC
  Filled 2012-03-17: qty 100

## 2012-03-17 MED ORDER — LACTATED RINGERS IV SOLN
INTRAVENOUS | Status: DC | PRN
  Administered 2012-03-17: 12:00:00 via INTRAVENOUS

## 2012-03-17 MED ORDER — MIDAZOLAM HCL 5 MG/5ML IJ SOLN
INTRAMUSCULAR | Status: DC | PRN
  Administered 2012-03-17: 2 mg via INTRAVENOUS

## 2012-03-17 MED ORDER — SUCCINYLCHOLINE CHLORIDE 20 MG/ML IJ SOLN
INTRAMUSCULAR | Status: DC | PRN
  Administered 2012-03-17: 100 mg via INTRAVENOUS

## 2012-03-17 MED ORDER — BIOTENE DRY MOUTH MT LIQD
15.0000 mL | Freq: Two times a day (BID) | OROMUCOSAL | Status: DC
  Administered 2012-03-17 – 2012-03-18 (×2): 15 mL via OROMUCOSAL

## 2012-03-17 MED ORDER — CHLORHEXIDINE GLUCONATE 0.12 % MT SOLN
15.0000 mL | Freq: Two times a day (BID) | OROMUCOSAL | Status: DC
  Administered 2012-03-17: 15 mL via OROMUCOSAL
  Filled 2012-03-17 (×3): qty 15

## 2012-03-17 SURGICAL SUPPLY — 29 items
APL SKNCLS STERI-STRIP NONHPOA (GAUZE/BANDAGES/DRESSINGS)
BENZOIN TINCTURE PRP APPL 2/3 (GAUZE/BANDAGES/DRESSINGS) IMPLANT
CANISTER SUCTION 2500CC (MISCELLANEOUS) ×2 IMPLANT
CLOTH BEACON ORANGE TIMEOUT ST (SAFETY) ×2 IMPLANT
COVER SURGICAL LIGHT HANDLE (MISCELLANEOUS) ×2 IMPLANT
DECANTER SPIKE VIAL GLASS SM (MISCELLANEOUS) IMPLANT
DRAPE LAPAROSCOPIC ABDOMINAL (DRAPES) ×2 IMPLANT
ELECT REM PT RETURN 9FT ADLT (ELECTROSURGICAL) ×2
ELECTRODE REM PT RTRN 9FT ADLT (ELECTROSURGICAL) ×1 IMPLANT
GLOVE BIOGEL M 8.0 STRL (GLOVE) ×2 IMPLANT
GLOVE BIOGEL PI IND STRL 7.0 (GLOVE) ×1 IMPLANT
GLOVE BIOGEL PI INDICATOR 7.0 (GLOVE) ×1
GOWN STRL NON-REIN LRG LVL3 (GOWN DISPOSABLE) ×2 IMPLANT
GOWN STRL REIN XL XLG (GOWN DISPOSABLE) ×4 IMPLANT
HAND ACTIVATED (MISCELLANEOUS) ×1 IMPLANT
KIT BASIN OR (CUSTOM PROCEDURE TRAY) ×2 IMPLANT
SET IRRIG TUBING LAPAROSCOPIC (IRRIGATION / IRRIGATOR) ×1 IMPLANT
SLEEVE Z-THREAD 5X100MM (TROCAR) IMPLANT
SOLUTION ANTI FOG 6CC (MISCELLANEOUS) ×2 IMPLANT
STRIP CLOSURE SKIN 1/2X4 (GAUZE/BANDAGES/DRESSINGS) IMPLANT
SUT VIC AB 4-0 SH 18 (SUTURE) ×1 IMPLANT
SYR 30ML LL (SYRINGE) ×2 IMPLANT
TRAY FOLEY CATH 14FRSI W/METER (CATHETERS) ×2 IMPLANT
TRAY LAP CHOLE (CUSTOM PROCEDURE TRAY) ×2 IMPLANT
TROCAR HASSON GELL 12X100 (TROCAR) IMPLANT
TROCAR Z-THREAD FIOS 11X100 BL (TROCAR) IMPLANT
TROCAR Z-THREAD FIOS 5X100MM (TROCAR) ×3 IMPLANT
TROCAR Z-THREAD SLEEVE 11X100 (TROCAR) IMPLANT
TUBING INSUFFLATION 10FT LAP (TUBING) ×2 IMPLANT

## 2012-03-17 NOTE — ED Provider Notes (Signed)
Medical screening examination/treatment/procedure(s) were performed by non-physician practitioner and as supervising physician I was immediately available for consultation/collaboration.  Derwood Kaplan, MD 03/17/12 [phone]

## 2012-03-17 NOTE — Care Management Note (Signed)
    Page 1 of 1   03/17/2012     9:13:27 AM   CARE MANAGEMENT NOTE 03/17/2012  Patient:  Phillip Choi,Phillip Choi   Account Number:  [id number]  Date Initiated:  03/17/2012  Documentation initiated by:  Lorenda Ishihara  Subjective/Objective Assessment:   42 yo male admitted with SBO. PTA lived at home with spouse.     Action/Plan:   Home when stable   Anticipated DC Date:  03/21/2012   Anticipated DC Plan:  HOME/SELF CARE      DC Planning Services  CM consult      Choice offered to / List presented to:             Status of service:  Completed, signed off Medicare Important Message given?   (If response is "NO", the following Medicare IM given date fields will be blank) Date Medicare IM given:   Date Additional Medicare IM given:    Discharge Disposition:  HOME/SELF CARE  Per UR Regulation:  Reviewed for med. necessity/level of care/duration of stay  If discussed at Long Length of Stay Meetings, dates discussed:    Comments:

## 2012-03-17 NOTE — Op Note (Signed)
Surgeon: Wenda Low, MD, FACS  Asst:  none  Anes:  Gen.  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 The abdomen was prepped with PCMX and draped sterilely. 5 mm Optiview was used to the left upper quadrant to access the abdomen. 3 other 5 mm were used in the left side and 2 on the right. Noticeable was a distended Meckel's diverticulum in the right lower quadrant. This indicated to me a distal ileal obstruction beyond the Meckel's. Went ahead and identified the cecum and worked my way back were and just before the Meckel's found the small intestine stuck down to the base of the Meckel's. I went ahead and sharply lysed this which straightened out the ileum where it was buckled. Also appeared that just beyond the Meckel's the small intestine was somewhat narrowed and more of a chronic fashion. I freed that up so that it looked like it was revealed to dilate. Felt that resection would obligate him to a laparotomy and after freeing this up but this could well solve this problem.  I examined the area of enteral lysis from different positions and everything looked good. The port sites were injected with Marcaine and closed with 4-0 Vicryl and Dermabond. Patient are the procedure well taken recovery in satisfactory condition.  Matt B. Daphine Deutscher, MD, [facility], [place] [phone]

## 2012-03-17 NOTE — Progress Notes (Signed)
Patient ID: Phillip Choi, male   DOB: [birth year], 42 y.o.   MRN: [id number] [facility] Progress Note:   * No surgery date entered *  Subjective: Mental status is clear Objective: Vital signs in last 24 hours: Temp:  [97.8 F (36.6 C)-98.7 F (37.1 C)] 98.2 F (36.8 C) (02/07 0956) Pulse Rate:  [65-77] 70  (02/07 0956) Resp:  [14-20] 18  (02/07 0956) BP: (103-126)/(63-88) 111/74 mmHg (02/07 0956) SpO2:  [96 %-100 %] 96 % (02/07 0956) Weight:  [198 lb 13.7 oz (90.2 kg)] 198 lb 13.7 oz (90.2 kg) (02/06 2340)  Intake/Output from previous day: 02/06 0701 - 02/07 0700 In: 635.4 [I.V.:635.4] Out: -  Intake/Output this shift:    Physical Exam: Work of breathing is normal.  Abdomen is nontender.    Lab Results:  Results for orders placed during the hospital encounter of 03/16/12 (from the past 48 hour(s))  CBC WITH DIFFERENTIAL     Status: Abnormal   Collection Time   03/16/12  6:51 PM      Component Value Range Comment   WBC 12.5 (*) 4.0 - 10.5 K/uL    RBC 5.23  4.22 - 5.81 MIL/uL    Hemoglobin 16.8  13.0 - 17.0 g/dL    HCT 40.9  81.1 - 91.4 %    MCV 90.2  78.0 - 100.0 fL    MCH 32.1  26.0 - 34.0 pg    MCHC 35.6  30.0 - 36.0 g/dL    RDW 78.2 (*) 95.6 - 15.5 %    Platelets 284  150 - 400 K/uL    Neutrophils Relative 82 (*) 43 - 77 %    Neutro Abs 10.3 (*) 1.7 - 7.7 K/uL    Lymphocytes Relative 9 (*) 12 - 46 %    Lymphs Abs 1.1  0.7 - 4.0 K/uL    Monocytes Relative 8  3 - 12 %    Monocytes Absolute 1.1 (*) 0.1 - 1.0 K/uL    Eosinophils Relative 0  0 - 5 %    Eosinophils Absolute 0.0  0.0 - 0.7 K/uL    Basophils Relative 0  0 - 1 %    Basophils Absolute 0.0  0.0 - 0.1 K/uL   COMPREHENSIVE METABOLIC PANEL     Status: Abnormal   Collection Time   03/16/12  6:51 PM      Component Value Range Comment   Sodium 136  135 - 145 mEq/L    Potassium 4.0  3.5 - 5.1 mEq/L    Chloride 97  96 - 112 mEq/L    CO2 25  19 - 32 mEq/L    Glucose, Bld 106 (*) 70 - 99 mg/dL    BUN  17  6 - 23 mg/dL    Creatinine, Ser 2.13  0.50 - 1.35 mg/dL    Calcium 08.6  8.4 - 10.5 mg/dL    Total Protein 9.1 (*) 6.0 - 8.3 g/dL    Albumin 4.8  3.5 - 5.2 g/dL    AST 20  0 - 37 U/L    ALT 17  0 - 53 U/L    Alkaline Phosphatase 91  39 - 117 U/L    Total Bilirubin 0.6  0.3 - 1.2 mg/dL    GFR calc non Af Amer 90 (*) >90 mL/min    GFR calc Af Amer >90  >90 mL/min   BASIC METABOLIC PANEL     Status: Abnormal   Collection Time   03/17/12  4:12 AM      Component Value Range Comment   Sodium 137  135 - 145 mEq/L    Potassium 4.0  3.5 - 5.1 mEq/L    Chloride 105  96 - 112 mEq/L DELTA CHECK NOTED   CO2 26  19 - 32 mEq/L    Glucose, Bld 124 (*) 70 - 99 mg/dL    BUN 17  6 - 23 mg/dL    Creatinine, Ser 1.61  0.50 - 1.35 mg/dL    Calcium 8.5  8.4 - 09.6 mg/dL    GFR calc non Af Amer 87 (*) >90 mL/min    GFR calc Af Amer >90  >90 mL/min   CBC     Status: Abnormal   Collection Time   03/17/12  4:12 AM      Component Value Range Comment   WBC 7.5  4.0 - 10.5 K/uL    RBC 3.85 (*) 4.22 - 5.81 MIL/uL    Hemoglobin 12.3 (*) 13.0 - 17.0 g/dL    HCT 04.5 (*) 40.9 - 52.0 %    MCV 91.7  78.0 - 100.0 fL    MCH 31.4  26.0 - 34.0 pg    MCHC 34.3  30.0 - 36.0 g/dL    RDW 81.1  91.4 - 78.2 %    Platelets 233  150 - 400 K/uL     Radiology/Results: Ct Abdomen Pelvis W Contrast  03/16/2012  *RADIOLOGY REPORT*  Clinical Data: Abdominal pain for 2 days with vomiting today. Current radiographs show evidence of a small bowel obstruction.  CT ABDOMEN AND PELVIS WITH CONTRAST  Technique:  Multidetector CT imaging of the abdomen and pelvis was performed following the standard protocol during bolus administration of intravenous contrast.  Contrast: 50mL OMNIPAQUE IOHEXOL 300 MG/ML  SOLN, OMNIPAQUE IOHEXOL 300 MG/ML  SOLN  Comparison: Current abdomen radiographs and prior abdomen CT, 05/13/2004  Findings: There are findings of a partial small bowel obstruction. The transition point appears to be in the right  upper quadrant in the proximal to mid ileum.  The jejunum is dilated diffusely with a maximal dilation measured at 4 cm.  There is no bowel wall dilation.  There is no mesenteric inflammation.  The colon is mostly decompressed.  A normal appendix is visualized.  The liver, spleen, gallbladder and pancreas are unremarkable. There is no bile duct dilation.  Normal adrenal glands. There is a 1 ml nonobstructing stone in the lower pole of the left kidney. Kidneys are otherwise unremarkable.  Normal ureters.  Normal bladder.  No pathologically enlarged lymph nodes.  No abnormal fluid collections.  There are mild degenerative changes of the visualized spine most prominent at L5-S1.  No osteoblastic or osteolytic lesions.  IMPRESSION: Partial small bowel obstruction with the transition point in the right upper quadrant.  No evidence of bowel inflammation or ischemia.  No other acute findings.   Original Report Authenticated By: Amie Portland, M.D.    Dg Abd 2 Views  03/16/2012  *RADIOLOGY REPORT*  Clinical Data: Abdominal pain, nausea and vomiting  ABDOMEN - 2 VIEW  Comparison: 05/16/2004; abdominal CT - 05/13/2004  Findings:  There is moderate gaseous distension of multiple loops of small bowel with index loop within the right upper abdominal quadrant measuring approximately 4.6 cm in diameter.  This finding is associated with several air fluid levels of differing heights on the  provided upright abdominal radiograph.  There is a conspicuous paucity of distal colonic gas.  No definite pneumoperitoneum,  pneumatosis or portal venous gas.  No definite abnormal intra-abdominal calcifications.  Limited visualization lower thorax normal.  No acute osseous abnormalities.  IMPRESSION: Findings worrisome for small bowel obstruction.  Further evaluation of abdominal CT may be performed as clinically indicated.   Original Report Authenticated By: Tacey Ruiz, MD     Anti-infectives: Anti-infectives    None       Assessment/Plan: Problem List: Patient Active Problem List  Diagnosis  . HYPERTENSION, BENIGN, TRANSIENT  . CARDIAC MURMUR  . CHEST DISCOMFORT  . SBO (small bowel obstruction)  . Heartburn    CT reviewed.  Recurrent symptoms of SBO and transition point in upper abdomen.  Could be Meckel's band or internal hernia.  Discussed limitations of surgery and goals.  He wants to proceed with laparoscopy and possible laparotomy if findings indicate.  * No surgery date entered *    LOS: 1 day   Matt B. Daphine Deutscher, MD, [facility] [phone] beeper [phone]  03/17/2012 10:25 AM

## 2012-03-17 NOTE — Anesthesia Preprocedure Evaluation (Addendum)
Anesthesia Evaluation  Patient identified by MRN, date of birth, ID band Patient awake    Reviewed: Allergy & Precautions, H&P , NPO status , Patient's Chart, lab work & pertinent test results, reviewed documented beta blocker date and time   Airway Mallampati: II TM Distance: >3 FB Neck ROM: full    Dental   Pulmonary neg pulmonary ROS,  breath sounds clear to auscultation        Cardiovascular hypertension, + Valvular Problems/Murmurs Rhythm:regular     Neuro/Psych negative neurological ROS  negative psych ROS   GI/Hepatic negative GI ROS, Neg liver ROS,   Endo/Other  negative endocrine ROS  Renal/GU Renal disease  negative genitourinary   Musculoskeletal   Abdominal   Peds  Hematology negative hematology ROS (+)   Anesthesia Other Findings See surgeon's H&P   Reproductive/Obstetrics negative OB ROS                           Anesthesia Physical Anesthesia Plan  ASA: II and emergent  Anesthesia Plan: General   Post-op Pain Management:    Induction: Intravenous  Airway Management Planned: Oral ETT  Additional Equipment:   Intra-op Plan:   Post-operative Plan: Extubation in OR  Informed Consent: I have reviewed the patients History and Physical, chart, labs and discussed the procedure including the risks, benefits and alternatives for the proposed anesthesia with the patient or authorized representative who has indicated his/her understanding and acceptance.   Dental Advisory Given  Plan Discussed with: CRNA and Surgeon  Anesthesia Plan Comments:         Anesthesia Quick Evaluation

## 2012-03-17 NOTE — Preoperative (Signed)
Beta Blockers   Reason not to administer Beta Blockers:Not Applicable 

## 2012-03-17 NOTE — Progress Notes (Signed)
MD updated on status of ED being unable to place NG tube.  MD said it was okay to try again with an 18 french NG tube.  Explained to patient benefits of NG tube placement.  Pt states that he feels comfortable at the moment with no nausea or vomiting.  Told patient if he changes his mind to call.  Will put in an order for NG tube placement through [facility] per MD orders in the event NG tube was not placed.  Will continue to monitor and attempt placement if patient changes his mind.    Sherron Monday

## 2012-03-17 NOTE — Transfer of Care (Signed)
Immediate Anesthesia Transfer of Care Note  Patient: Phillip Choi  Procedure(s) Performed: Procedure(s) (LRB): LAPAROSCOPY DIAGNOSTIC (N/A)  Patient Location: PACU  Anesthesia Type: General  Level of Consciousness: sedated, patient cooperative and responds to stimulaton  Airway & Oxygen Therapy: Patient Spontanous Breathing and Patient connected to face mask oxgen  Post-op Assessment: Report given to PACU RN and Post -op Vital signs reviewed and stable  Post vital signs: Reviewed and stable  Complications: No apparent anesthesia complications

## 2012-03-18 NOTE — Discharge Summary (Signed)
Physician Discharge Summary  Patient ID: Phillip Choi MRN: [id number] DOB/AGE: 10/14/1970 42 y.o.  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  SBO resolved.    Consults: none  Significant Diagnostic Studies: CT scan on admission    Discharge Exam: Blood pressure 109/67, pulse 64, temperature 97.6 F (36.4 C), temperature source Oral, resp. rate 18, height 6\' 2"  (1.88 m), weight 198 lb 13.7 oz (90.2 kg), SpO2 97.00%. Nontender.  Small right inguinal hernia noted at surgery and on exam.  Manage expectantly  Disposition: Final discharge disposition not confirmed     Medication List    ASK your doctor about these medications       naproxen 250 MG tablet  Commonly known as:  NAPROSYN  Take 250 mg by mouth as needed. PAIN     penicillin v potassium 500 MG tablet  Commonly known as:  VEETID  Take 500 mg by mouth 3 (three) times daily.     pseudoephedrine-guaifenesin 60-600 MG per tablet  Commonly known as:  MUCINEX D  Take 1 tablet by mouth as needed. CONGESTION     ZOFRAN 4 MG tablet  Generic drug:  ondansetron  Take 4 mg by mouth every 8 (eight) hours as needed. NAUSEA           Follow-up Information   Follow up with Valarie Merino, MD.   Contact information:   [address] [place] [address] [phone]       Signed: Valarie Merino 03/18/2012, 10:36 AM

## 2012-03-18 NOTE — Anesthesia Postprocedure Evaluation (Signed)
Anesthesia Post Note  Patient: Phillip Choi  Procedure(s) Performed: Procedure(s) (LRB): intrabdominal lysis (N/A)  Anesthesia type: general  Patient location: PACU  Post pain: Pain level controlled  Post assessment: Patient's Cardiovascular Status Stable  Last Vitals:  Filed Vitals:   03/18/12 0500  BP: 109/67  Pulse: 64  Temp: 36.4 C  Resp: 18    Post vital signs: Reviewed and stable  Level of consciousness: sedated  Complications: No apparent anesthesia complications

## 2012-03-18 NOTE — ED Provider Notes (Signed)
Medical screening examination/treatment/procedure(s) were performed by non-physician practitioner and as supervising physician I was immediately available for consultation/collaboration.   Laray Anger, DO 03/18/12 1713

## 2012-03-20 ENCOUNTER — Encounter (HOSPITAL_COMMUNITY): Payer: Self-pay | Admitting: Surgery

## 2013-02-08 HISTORY — PX: COLON RESECTION: SHX5231

## 2017-02-17 ENCOUNTER — Encounter (HOSPITAL_COMMUNITY): Payer: Self-pay | Admitting: Emergency Medicine

## 2017-02-17 ENCOUNTER — Other Ambulatory Visit: Payer: Self-pay

## 2017-02-17 ENCOUNTER — Emergency Department (HOSPITAL_COMMUNITY): Payer: 59

## 2017-02-17 ENCOUNTER — Emergency Department (HOSPITAL_COMMUNITY)
Admission: EM | Admit: 2017-02-17 | Discharge: 2017-02-17 | Disposition: A | Discharge status: Home / Self Care | Payer: 59 | Attending: Emergency Medicine | Admitting: Emergency Medicine

## 2017-02-17 DIAGNOSIS — R51 Headache: Secondary | ICD-10-CM | POA: Diagnosis present

## 2017-02-17 DIAGNOSIS — R519 Headache, unspecified: Secondary | ICD-10-CM

## 2017-02-17 DIAGNOSIS — I10 Essential (primary) hypertension: Secondary | ICD-10-CM | POA: Insufficient documentation

## 2017-02-17 LAB — I-STAT TROPONIN, ED: Troponin i, poc: 0.01 ng/mL (ref 0.00–0.08)

## 2017-02-17 LAB — CBC
HEMATOCRIT: 42.5 % (ref 39.0–52.0)
HEMOGLOBIN: 14.2 g/dL (ref 13.0–17.0)
MCH: 31.1 pg (ref 26.0–34.0)
MCHC: 33.4 g/dL (ref 30.0–36.0)
MCV: 93 fL (ref 78.0–100.0)
Platelets: 292 10*3/uL (ref 150–400)
RBC: 4.57 MIL/uL (ref 4.22–5.81)
RDW: 11.8 % (ref 11.5–15.5)
WBC: 8.3 10*3/uL (ref 4.0–10.5)

## 2017-02-17 LAB — BASIC METABOLIC PANEL
ANION GAP: 10 (ref 5–15)
BUN: 15 mg/dL (ref 6–20)
CO2: 25 mmol/L (ref 22–32)
Calcium: 9.6 mg/dL (ref 8.9–10.3)
Chloride: 103 mmol/L (ref 101–111)
Creatinine, Ser: 1 mg/dL (ref 0.61–1.24)
GFR calc Af Amer: >60 mL/min (ref >60)
GFR calc non Af Amer: >60 mL/min (ref >60)
GLUCOSE: 104 mg/dL — AB (ref 65–99)
POTASSIUM: 3.7 mmol/L (ref 3.5–5.1)
Sodium: 138 mmol/L (ref 135–145)

## 2017-02-17 MED ORDER — METOCLOPRAMIDE HCL 5 MG/ML IJ SOLN
10.0000 mg | Freq: Once | INTRAMUSCULAR | Status: AC
  Administered 2017-02-17: 10 mg via INTRAVENOUS
  Filled 2017-02-17: qty 2

## 2017-02-17 MED ORDER — SODIUM CHLORIDE 0.9 % IV BOLUS (SEPSIS)
1000.0000 mL | Freq: Once | INTRAVENOUS | Status: AC
  Administered 2017-02-17: 1000 mL via INTRAVENOUS

## 2017-02-17 NOTE — Discharge Instructions (Signed)
Your CT scan did not show evidence of significant M normality causing her headaches.  As her symptoms have improved, we feel you are safe for discharge home.  Please follow-up with a primary care physician for reassessment.  If any symptoms change or worsen, please return to the nearest emergency department.  Please stay hydrated.

## 2017-02-17 NOTE — ED Provider Notes (Signed)
8:37 AM Care assumed from Dr. Roxanne Mins.  At time of transfer of care, patient is awaiting results of CT head to look for abnormalities which may have contributed to his headache.  According to previous team, patient's headache was resolved with headache cocktail.  CT scan returned showing no evidence of intracranial hemorrhage or large mass.  There was evidence of some mild atrophy but no other intracranial abnormality seen.  Patient will be reassessed and if headache is still improved, will be discharged home.  Patient reports headache is still completely resolved.  Patient informed of findings of atrophy and instructed to follow-up with PCP in several days for reassessment and further management.  Patient understood return precautions for any new or worsening symptoms.  Patient had no other questions or concerns and patient was discharged in good condition.   Clinical Impression: 1. Headache, unspecified headache type   2. Bad headache     Disposition: Discharge  Condition: Good  I have discussed the results, Dx and Tx plan with the pt(& family if present). He/she/they expressed understanding and agree(s) with the plan. Discharge instructions discussed at great length. Strict return precautions discussed and pt &/or family have verbalized understanding of the instructions. No further questions at time of discharge.    Discharge Medication List as of 02/17/2017  9:32 AM      Follow Up: [facility] [address] [place] [address] [phone] Schedule an appointment as soon as possible for a visit    [facility] [address] [id number] [place] [phone]  If symptoms worsen     Donavin Audino, Gwenyth Allegra, MD 02/17/17 1144

## 2017-02-17 NOTE — ED Provider Notes (Signed)
[facility] EMERGENCY DEPARTMENT Provider Note   CSN: [id number] Arrival date & time: 02/17/17  0359     History   Chief Complaint Chief Complaint  Patient presents with  . Headache  . Hypertension  . Palpitations    HPI Phillip Choi is a 47 y.o. male.  The history is provided by the patient.  At about 9 PM, he noted sudden onset of a right occipital headache.  Pain was sharp and he rated it at 7/10.  There is no associated nausea or vomiting.  There is no visual disturbance.  There is no weakness, numbness, tingling.  He took ibuprofen and acetaminophen without relief.  He states that he felt that his heart was doing something funny, but he could not describe it.  He went to sleep to see if that would help, but kept waking up because he was feeling restless.  He denies chest pain, heaviness, tightness, pressure.  Denies nausea or vomiting.  He states he usually does not get headaches.  Headache has subsided greatly and he rates it at 2/10 currently.  He denies photophobia or phonophobia.  Nothing seems to make it better and nothing seems to make it worse.  Past Medical History:  Diagnosis Date  . Renal disorder    hx of kidney stone    Patient Active Problem List   Diagnosis Date Noted  . 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 SBO (small bowel obstruction) ([place]) 03/16/2012  . Heartburn 03/16/2012  . HYPERTENSION, BENIGN, TRANSIENT 11/13/2009  . CARDIAC MURMUR 09/22/2009  . CHEST DISCOMFORT 09/22/2009    Past Surgical History:  Procedure Laterality Date  . gum graft     Feb 2014  . LAPAROSCOPY N/A 03/17/2012   Procedure: LAPAROSCOPY DIAGNOSTIC;  Surgeon: Pedro Earls, MD;  Location: [facility];  Service: General;  Laterality: N/A;  intrabdominal lysis  . Ureteroscopy with stone removal  2006   Dr. Serita Butcher: [facility]       Home Medications    Prior to Admission medications     Medication Sig Start Date End Date Taking? Authorizing Provider  ibuprofen (ADVIL,MOTRIN) 200 MG tablet Take 200 mg by mouth every 6 (six) hours as needed for headache.   Yes [provider]    Family History History reviewed. No pertinent family history.  Social History Social History   Tobacco Use  . Smoking status: Never Smoker  . Smokeless tobacco: Never Used  Substance Use Topics  . Alcohol use: Yes    Comment: 12 drinks/ week  . Drug use: No     Allergies   Patient has no known allergies.   Review of Systems Review of Systems  All other systems reviewed and are negative.    Physical Exam Updated Vital Signs BP 132/89   Pulse 64   Temp (!) 97.5 F (36.4 C) (Oral)   Resp 13   Ht 6\' 2"  (1.88 m)   Wt 90.7 kg (200 lb)   SpO2 98%   BMI 25.68 kg/m   Physical Exam  Nursing note and vitals reviewed.  47 year old male, resting comfortably and in no acute distress. Vital signs are normal. Oxygen saturation is 98%, which is normal. Head is normocephalic and atraumatic. PERRLA, EOMI. Oropharynx is clear. Neck is nontender and supple without adenopathy or JVD. Back is nontender and there is no CVA tenderness. Lungs are clear without rales, wheezes, or rhonchi. Chest is nontender. Heart has  regular rate and rhythm without murmur. Abdomen is soft, flat, nontender without masses or hepatosplenomegaly and peristalsis is normoactive. Extremities have no cyanosis or edema, full range of motion is present. Skin is warm and dry without rash. Neurologic: Mental status is normal, cranial nerves are intact, there are no motor or sensory deficits.  ED Treatments / Results  Labs (all labs ordered are listed, but only abnormal results are displayed) Labs Reviewed  BASIC METABOLIC PANEL - Abnormal; Notable for the following components:      Result Value   Glucose, Bld 104 (*)    All other components within normal limits  CBC  I-STAT TROPONIN, ED    EKG  EKG  Interpretation  Date/Time:  Thursday February 17 2017 04:08:35 EST Ventricular Rate:  78 PR Interval:  142 QRS Duration: 94 QT Interval:  386 QTC Calculation: 440 R Axis:   87 Text Interpretation:  Normal sinus rhythm with sinus arrhythmia Normal ECG No old tracing to compare Confirmed by Delora Fuel ([id number]) on 02/17/2017 5:56:56 AM       Radiology Dg Chest 2 View  Result Date: 02/17/2017 CLINICAL DATA:  47 year old male with chest pain. EXAM: CHEST  2 VIEW COMPARISON:  None. FINDINGS: The heart size and mediastinal contours are within normal limits. Both lungs are clear. The visualized skeletal structures are unremarkable. IMPRESSION: No active cardiopulmonary disease. Electronically Signed   By: Anner Crete M.D.   On: 02/17/2017 04:36    Procedures Procedures (including critical care time)  Medications Ordered in ED Medications  sodium chloride 0.9 % bolus 1,000 mL (1,000 mLs Intravenous New Bag/Given 02/17/17 0612)  metoCLOPramide (REGLAN) injection 10 mg (10 mg Intravenous Given 02/17/17 0612)     Initial Impression / Assessment and Plan / ED Course  I have reviewed the triage vital signs and the nursing notes.  Pertinent labs & imaging results that were available during my care of the patient were reviewed by me and considered in my medical decision making (see chart for details).  Headache of uncertain cause.  Exam right now is totally benign.  Neck is completely supple and he is in absolutely no distress.  However, given that patient does not usually have headaches and his headache came on somewhat suddenly, he is sent for CT of head.  Sense of his heart doing "something funny" is not clear what that represents.  He does relate that family members have had atrial fibrillation.  No atrial fibrillation seen on ECG or on monitor although occasional PAC was noted.  Old records are reviewed, and he has no relevant past visits.  He will be given a headache cocktail.  Initial  screening labs are unremarkable.  He had complete relief of headache with above noted treatment.  No arrhythmias seen.  CT is pending.  Case is signed out to Dr. Sherry Ruffing to evaluate CT report.  Based on non-concerning history and benign physical exam and response to headache cocktail, I do not feel that lumbar puncture is indicated if CT is negative.  Final Clinical Impressions(s) / ED Diagnoses   Final diagnoses:  Headache, unspecified headache type    ED Discharge Orders    None       Delora Fuel, MD 01/60/10 [phone]

## 2017-02-17 NOTE — ED Notes (Addendum)
Pt reports HA started around 9 pm on 02/17/16 and was 7/10. Pt has an automatic blood pressure cuff at home and checked his own BP and reports it was "130 over 90 something." Pt also reports his heart felt like it was racing and irregular. Pt is NSR on monitor. Pt reports BP is normally 120/80. Pt denies Hx of HTN. Pt reports HA is now 3/10. Pt reports waking up a few times throughout the night and feeling funny.

## 2017-02-17 NOTE — ED Triage Notes (Signed)
Pt presents with sharp pain to posterior head that began last night with no known cause, also reporting heart palpitations and high BP (took at home with auto machine); pt states "something just feels different"; pt denies any focal deficits

## 2018-08-06 ENCOUNTER — Telehealth: Payer: 59 | Admitting: Family

## 2018-08-06 DIAGNOSIS — Z20822 Contact with and (suspected) exposure to covid-19: Secondary | ICD-10-CM

## 2018-08-06 MED ORDER — BENZONATATE 100 MG PO CAPS: 100.0000 mg | ORAL_CAPSULE | Freq: Three times a day (TID) | ORAL | 0 refills | Status: DC | PRN

## 2018-08-06 NOTE — Progress Notes (Signed)
E-Visit for Corona Virus Screening   Your current symptoms could be consistent with the coronavirus.  Call your health care provider or local health department to request and arrange formal testing. Many health care providers can now test patients at their office but not all are.  Please quarantine yourself while awaiting your test results.  [place] [phone], [place], [place] [phone] or visit [email or web address]  and You have been enrolled in [facility] for COVID-19.  Daily you will receive a questionnaire within the [place] website. Our COVID-19 response team will be monitoring your responses daily.  Approximately 5 minutes was spent documenting and reviewing patient's chart.    COVID-19 is a respiratory illness with symptoms that are similar to the flu. Symptoms are typically mild to moderate, but there have been cases of severe illness and death due to the virus. The following symptoms may appear 2-14 days after exposure: . Fever . Cough . Shortness of breath or difficulty breathing . Chills . Repeated shaking with chills . Muscle pain . Headache . Sore throat . New loss of taste or smell . Fatigue . Congestion or runny nose . Nausea or vomiting . Diarrhea  It is vitally important that if you feel that you have an infection such as this virus or any other virus that you stay home and away from places where you may spread it to others.  You should self-quarantine for 14 days if you have symptoms that could potentially be coronavirus or have been in close contact a with a person diagnosed with COVID-19 within the last 2 weeks. You should avoid contact with people age 61 and older.   You should wear a mask or cloth face covering over your nose and mouth if you must be around other people or animals, including pets (even at  home). Try to stay at least 6 feet away from other people. This will protect the people around you.  You can use medication such as A prescription cough medication called Tessalon Perles 100 mg. You may take 1-2 capsules every 8 hours as needed for cough  You may also take acetaminophen (Tylenol) as needed for fever.   Reduce your risk of any infection by using the same precautions used for avoiding the common cold or flu:  Marland Kitchen Wash your hands often with soap and warm water for at least 20 seconds.  If soap and water are not readily available, use an alcohol-based hand sanitizer with at least 60% alcohol.  . If coughing or sneezing, cover your mouth and nose by coughing or sneezing into the elbow areas of your shirt or coat, into a tissue or into your sleeve (not your hands). . Avoid shaking hands with others and consider head nods or verbal greetings only. . Avoid touching your eyes, nose, or mouth with unwashed hands.  . Avoid close contact with people who are sick. . Avoid places or events with large numbers of people in one location, like concerts or sporting events. . Carefully consider travel plans you have or are making. . If you are planning any travel outside or inside the [place], visit the CDC's Travelers' Health webpage for the latest health notices. . If you have some symptoms but not all symptoms, continue to monitor at home and seek medical attention if your symptoms worsen. . If you are having a medical emergency, call 911.  HOME CARE . Only take medications as instructed by your medical  team. . Drink plenty of fluids and get plenty of rest. . A steam or ultrasonic humidifier can help if you have congestion.   GET HELP RIGHT AWAY IF YOU HAVE EMERGENCY WARNING SIGNS** FOR COVID-19. If you or someone is showing any of these signs seek emergency medical care immediately. Call 911 or proceed to your closest emergency facility if: . You develop worsening high fever. . Trouble  breathing . Bluish lips or face . Persistent pain or pressure in the chest . New confusion . Inability to wake or stay awake . You cough up blood. . Your symptoms become more severe  **This list is not all possible symptoms. Contact your medical provider for any symptoms that are sever or concerning to you.   MAKE SURE YOU   Understand these instructions.  Will watch your condition.  Will get help right away if you are not doing well or get worse.  Your e-visit answers were reviewed by a board certified advanced clinical practitioner to complete your personal care plan.  Depending on the condition, your plan could have included both over the counter or prescription medications.  If there is a problem please reply once you have received a response from your provider.  Your safety is important to [place].  If you have drug allergies check your prescription carefully.    You can use MyChart to ask questions about today's visit, request a non-urgent call back, or ask for a work or school excuse for 24 hours related to this e-Visit. If it has been greater than 24 hours you will need to follow up with your provider, or enter a new e-Visit to address those concerns. You will get an e-mail in the next two days asking about your experience.  I hope that your e-visit has been valuable and will speed your recovery. Thank you for using e-visits.

## 2018-08-07 ENCOUNTER — Telehealth: Payer: Self-pay | Admitting: *Deleted

## 2018-08-07 DIAGNOSIS — Z20822 Contact with and (suspected) exposure to covid-19: Secondary | ICD-10-CM

## 2018-08-07 NOTE — Telephone Encounter (Signed)
Patient had appointment with E- visit provider- order dropped for testing request. Call to patient to schedule. Left message for patient to call back.

## 2018-08-08 ENCOUNTER — Other Ambulatory Visit: Payer: 59

## 2018-08-08 DIAGNOSIS — Z20822 Contact with and (suspected) exposure to covid-19: Secondary | ICD-10-CM

## 2018-08-08 NOTE — Telephone Encounter (Signed)
Call to patient- patient scheduled for testing and orders placed

## 2018-08-08 NOTE — Telephone Encounter (Signed)
Patient contacted and orders placed

## 2018-08-09 ENCOUNTER — Encounter (INDEPENDENT_AMBULATORY_CARE_PROVIDER_SITE_OTHER): Payer: Self-pay

## 2018-08-09 ENCOUNTER — Telehealth: Payer: Self-pay | Admitting: *Deleted

## 2018-08-09 NOTE — Telephone Encounter (Signed)
Attempted to contact pt regarding my chart companion response of temp 101.0; left message on voicemail.

## 2018-08-10 ENCOUNTER — Encounter (INDEPENDENT_AMBULATORY_CARE_PROVIDER_SITE_OTHER): Payer: Self-pay

## 2018-08-11 ENCOUNTER — Encounter (INDEPENDENT_AMBULATORY_CARE_PROVIDER_SITE_OTHER): Payer: Self-pay

## 2018-08-12 ENCOUNTER — Encounter (INDEPENDENT_AMBULATORY_CARE_PROVIDER_SITE_OTHER): Payer: Self-pay

## 2018-08-14 ENCOUNTER — Encounter (INDEPENDENT_AMBULATORY_CARE_PROVIDER_SITE_OTHER): Payer: Self-pay

## 2018-08-14 ENCOUNTER — Telehealth: Payer: Self-pay | Admitting: *Deleted

## 2018-08-14 LAB — NOVEL CORONAVIRUS, NAA: SARS-CoV-2, NAA: DETECTED — AB

## 2018-08-14 NOTE — Telephone Encounter (Signed)
Contacted pt due to my chart enounter  Temp returned yesterday 101.0 - 102.0; he says that last night he developed low back pain/spasm; the pt says that he took 2 ibuprofen every hours for a total of 8 tablets; recommendations:  ACETAMINOPHEN (E.G., TYLENOL):  * Take 650 mg (two 325 mg pills) by mouth every 4-6 hours as needed. Each Regular Strength Tylenol pill has 325 mg of acetaminophen. The most you should take each day is 3,250 mg (10 Regular Strength pills a day).  * Another choice is to take 1,000 mg (two 500 mg pills) every 8 hours as needed. Each Extra Strength Tylenol pill has 500 mg of acetaminophen. The most you should take each day is 3,000 mg (6 Extra Strength pills a day).  IBUPROFEN (E.G., MOTRIN, ADVIL):  * Take 400 mg (two 200 mg pills) by mouth every 6 hours as needed.  * The most you should take each day is 1,200 mg (six 200 mg pills a day), unless your doctor has told you to take more.; pt also advised to contact his PCP for further recommendations; he verbalized understanding.

## 2018-08-16 ENCOUNTER — Encounter (INDEPENDENT_AMBULATORY_CARE_PROVIDER_SITE_OTHER): Payer: Self-pay

## 2018-08-16 IMAGING — CR DG CHEST 2V
2 series · 2 of 2 positions shown · non-contrast
Comparison: None.

CLINICAL DATA: 46-year-old male with chest pain.

EXAM:
CHEST  2 VIEW

[chest pa]
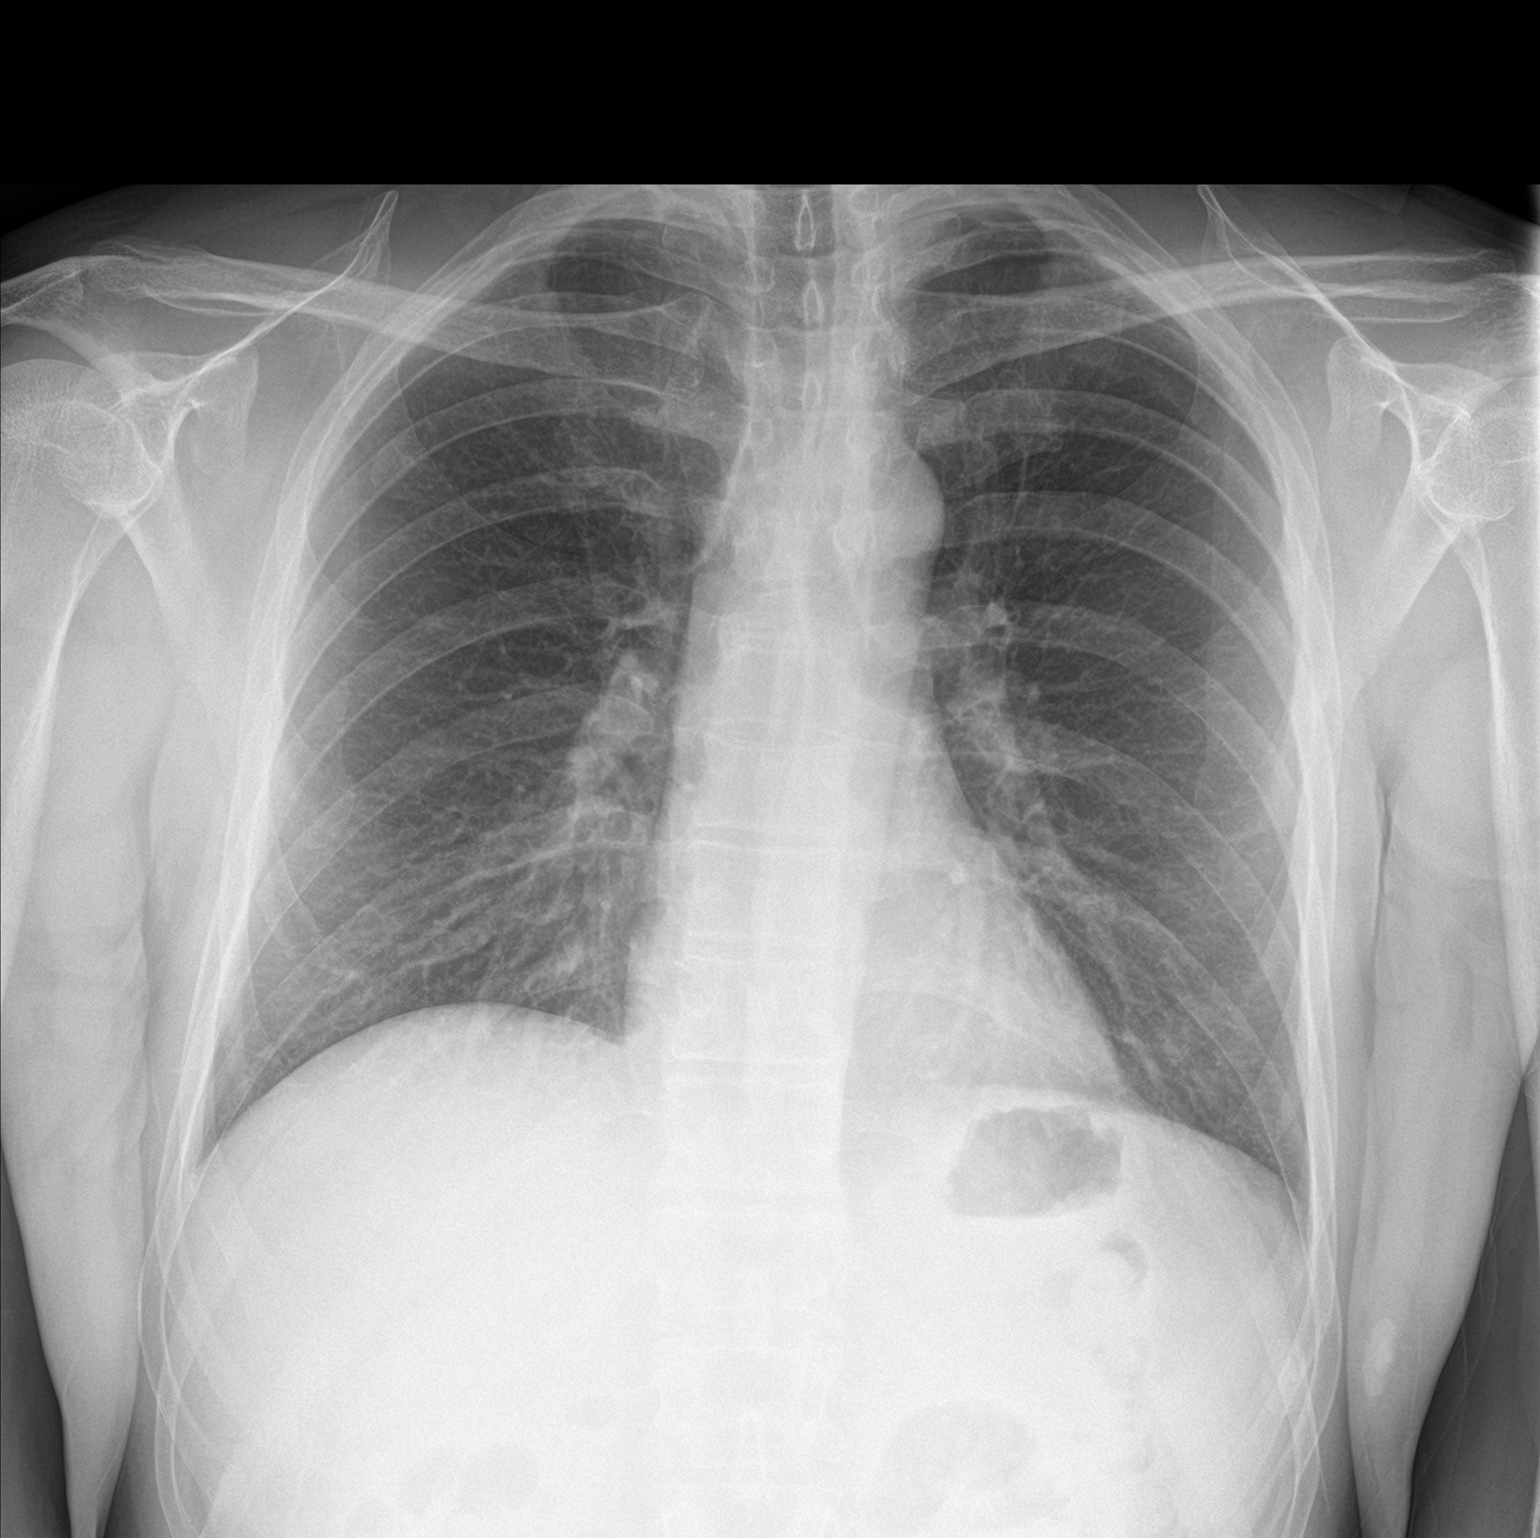

[chest lat]
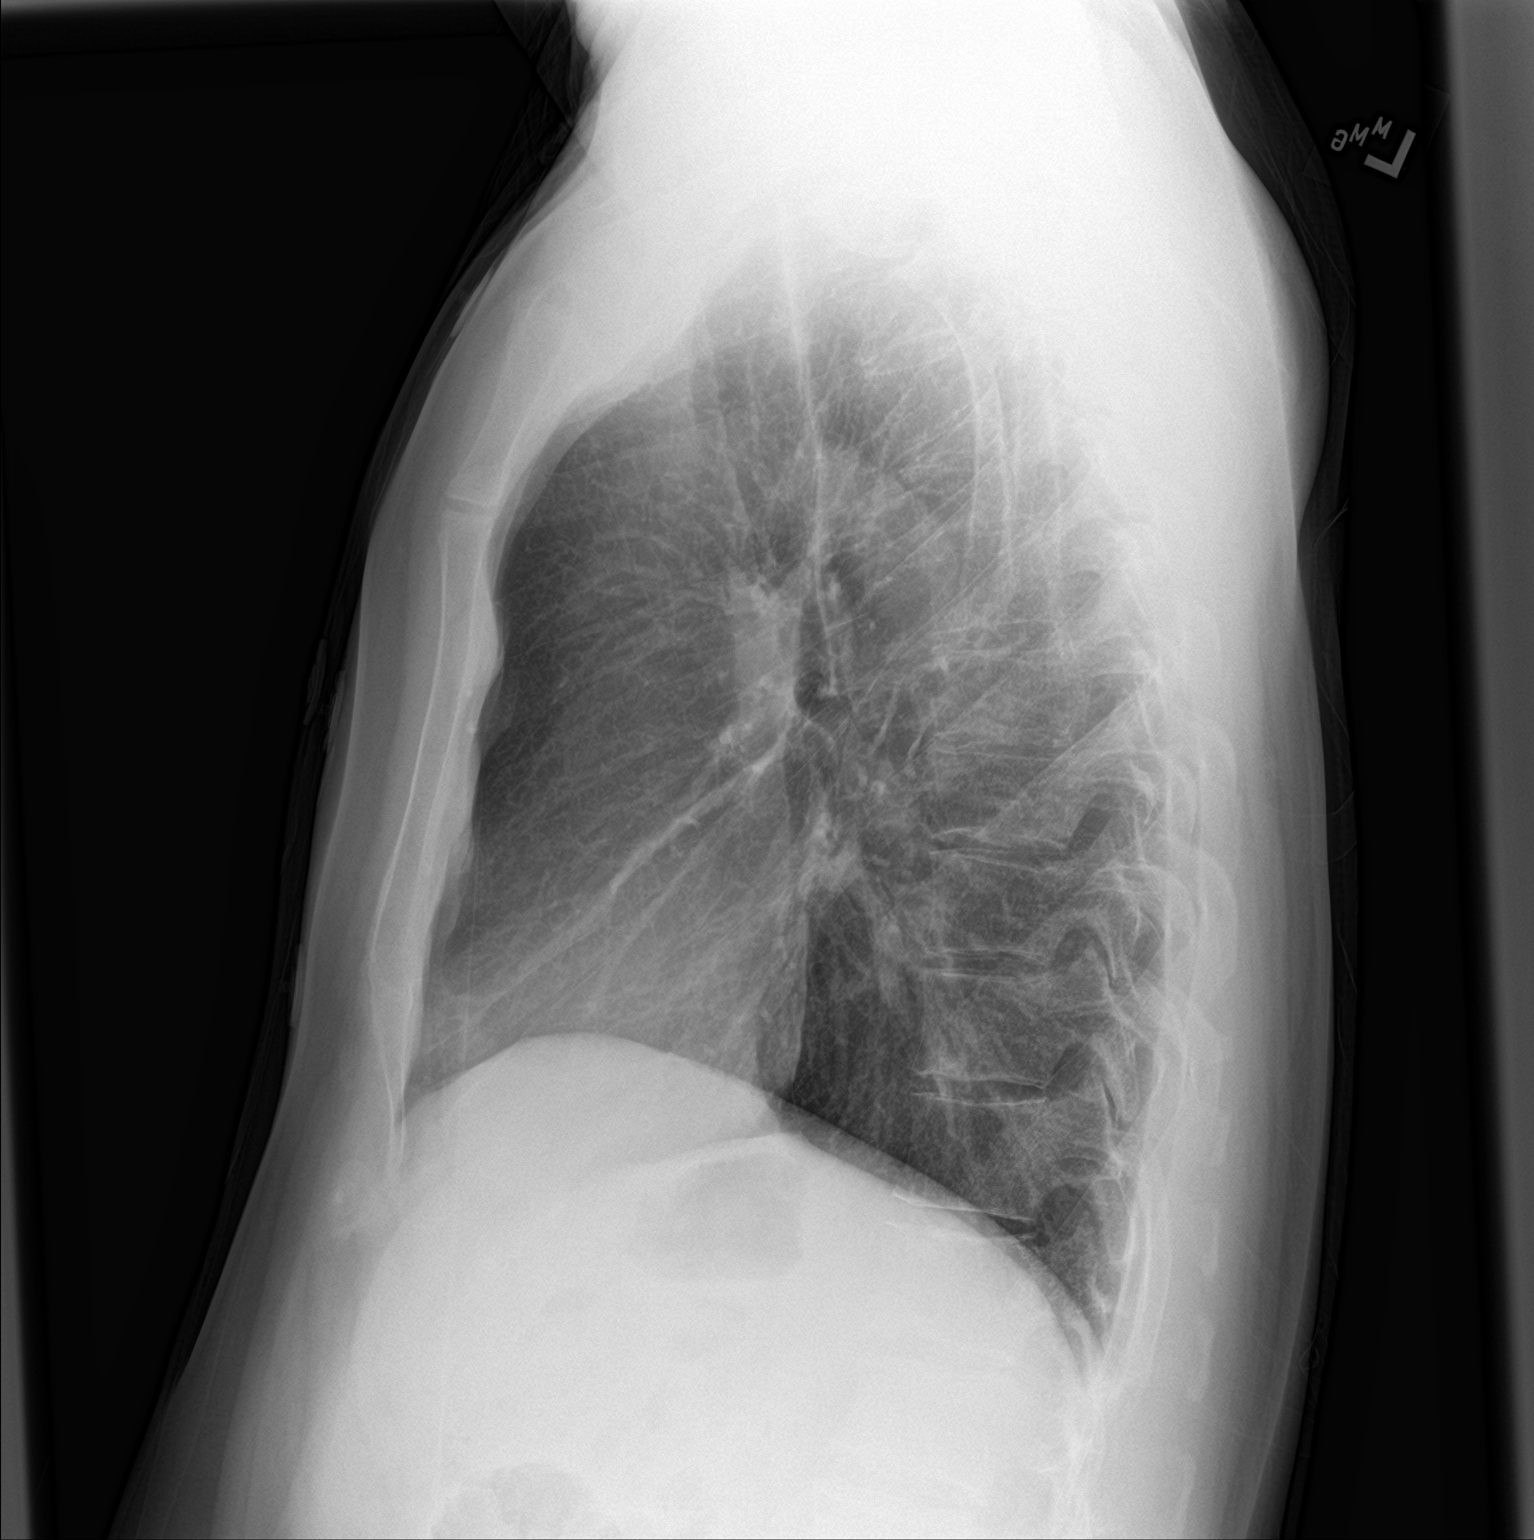

[2 of 2 positions shown; findings below may reference images not displayed]

FINDINGS: The heart size and mediastinal contours are within normal limits.
Both lungs are clear. The visualized skeletal structures are
unremarkable.
IMPRESSION: No active cardiopulmonary disease.

## 2018-08-16 IMAGING — CT CT HEAD W/O CM
4 series · 16 of 47 positions shown, 18 images · non-contrast
Comparison: None.

CLINICAL DATA: Acute posterior headache since last night.

EXAM:
CT HEAD WITHOUT CONTRAST
TECHNIQUE: Contiguous axial images were obtained from the base of the skull
through the vertex without intravenous contrast.

[Series 3: head wo · axial · 0.46mm/px · z∈[-95,+25]mm · 7 of 32 slices shown, 9 images]
[im 4/32  brain]
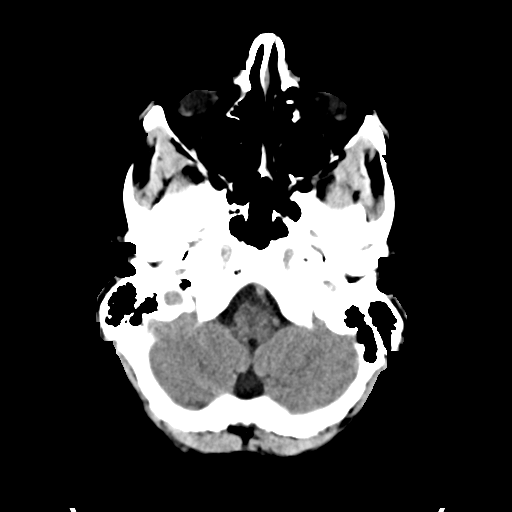
[im 4/32  bone]
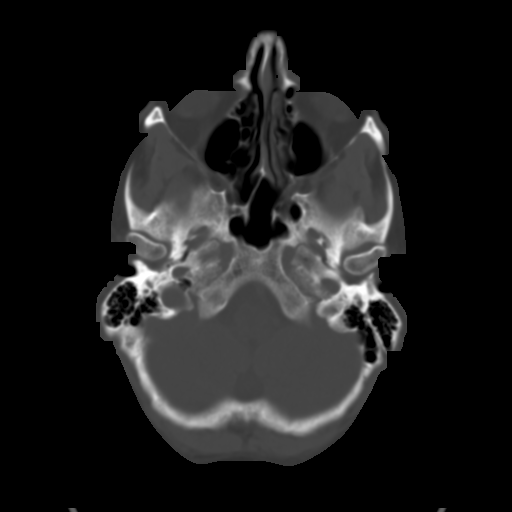
[im 8/32  brain]
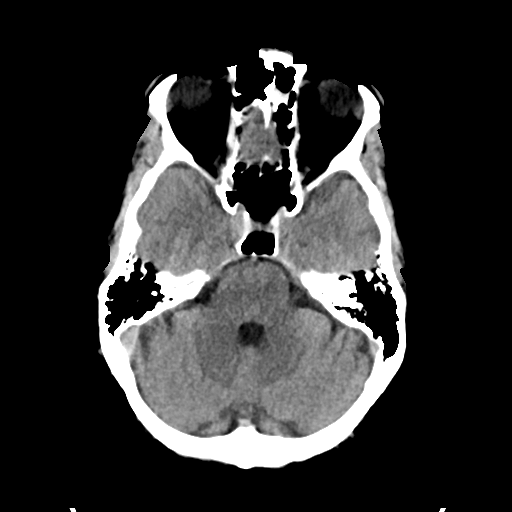
[im 12/32  brain]
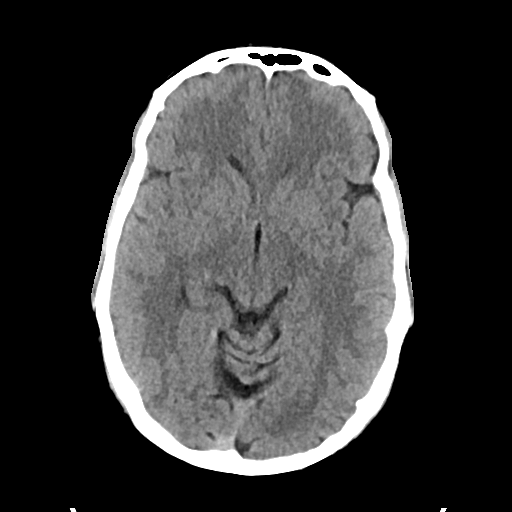
[im 16/32  brain]
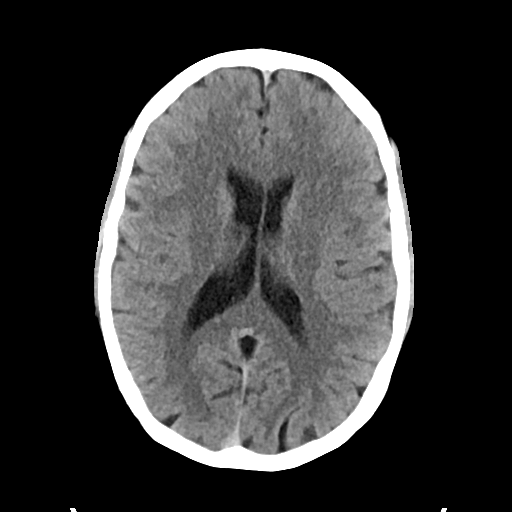
[im 20/32  brain]
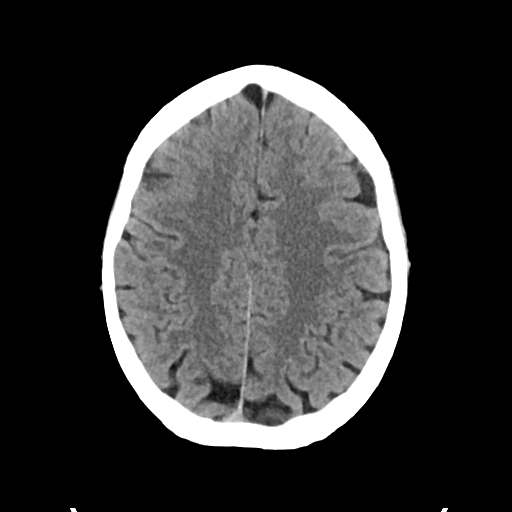
[im 20/32  bone]
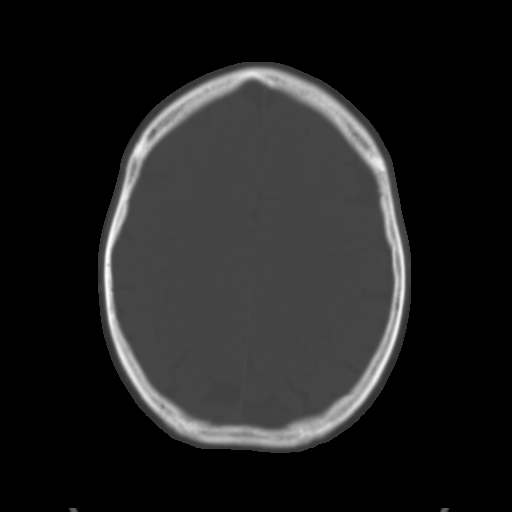
[im 24/32  brain]
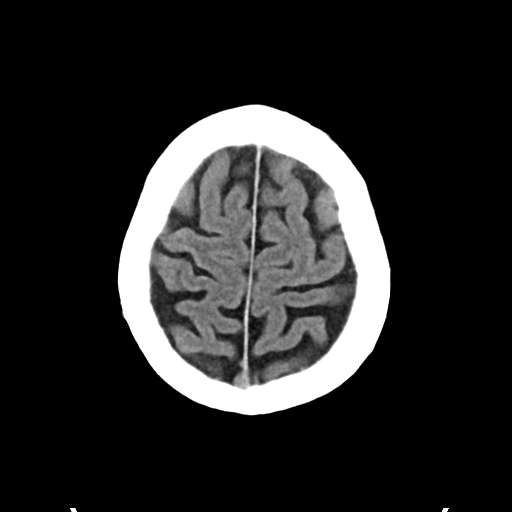
[im 28/32  brain]
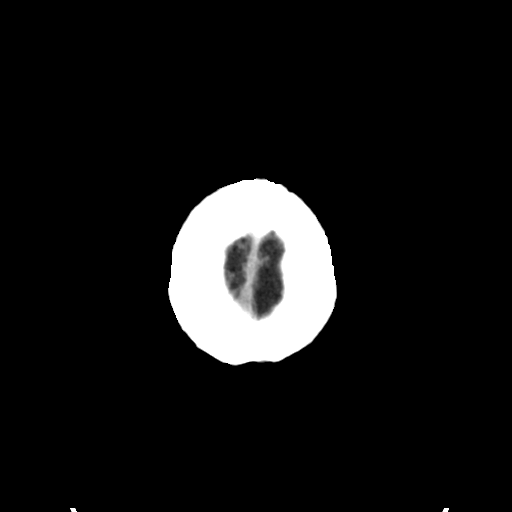

[Series 4: head bone · axial · 0.46mm/px · z∈[-96,-64]mm · 3 of 80 slices shown]
[im 8/80  bone]
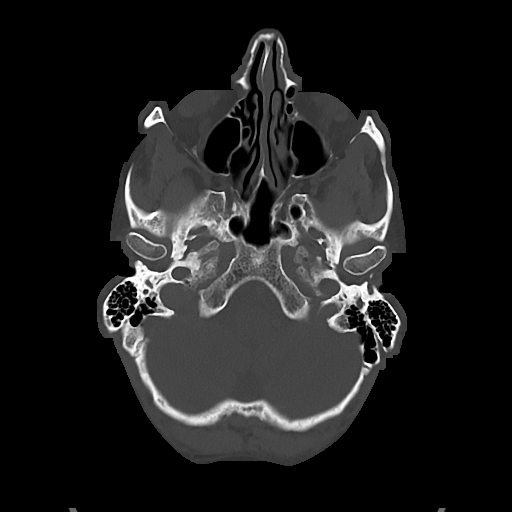
[im 16/80  bone]
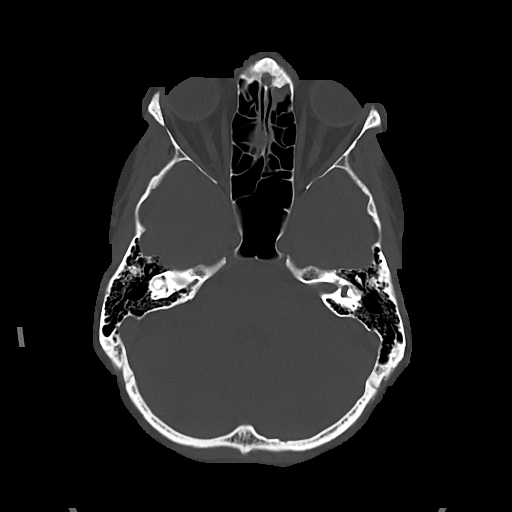
[im 24/80  bone]
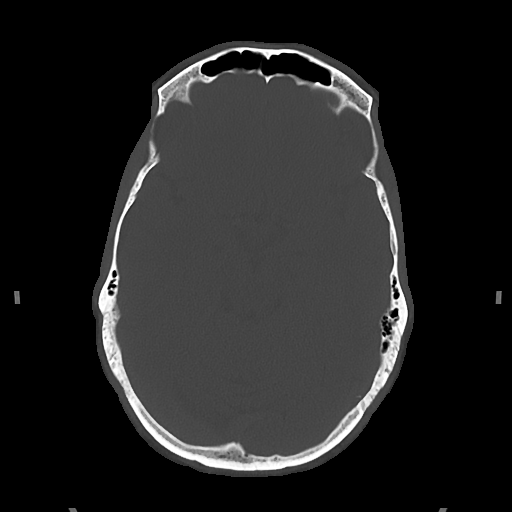

[Series 5: cor soft · coronal · 0.31mm/px · 3 of 76 slices shown]
[im 26/76  brain]
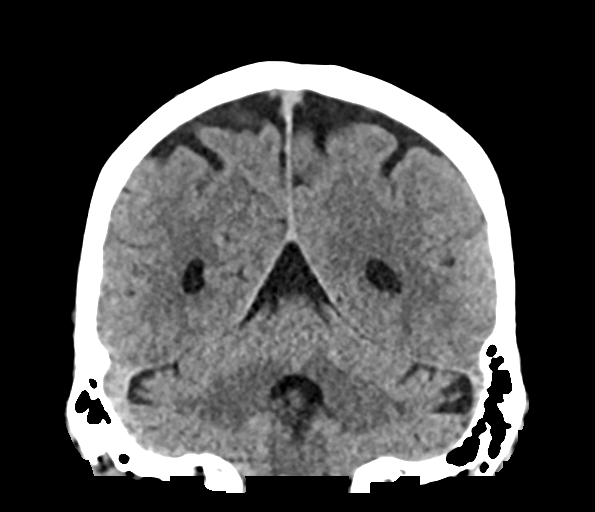
[im 34/76  brain]
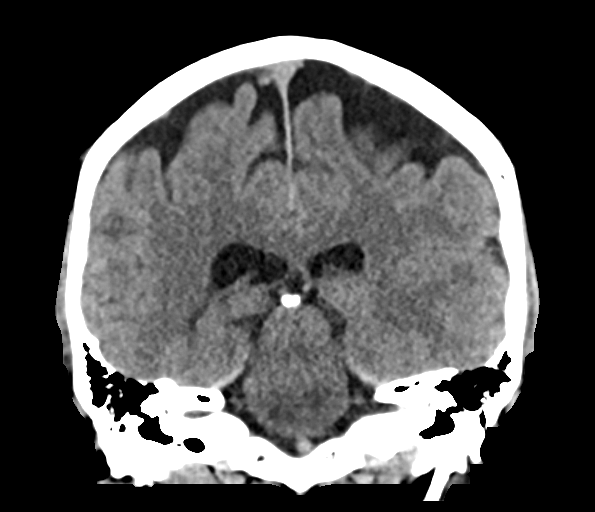
[im 42/76  brain]
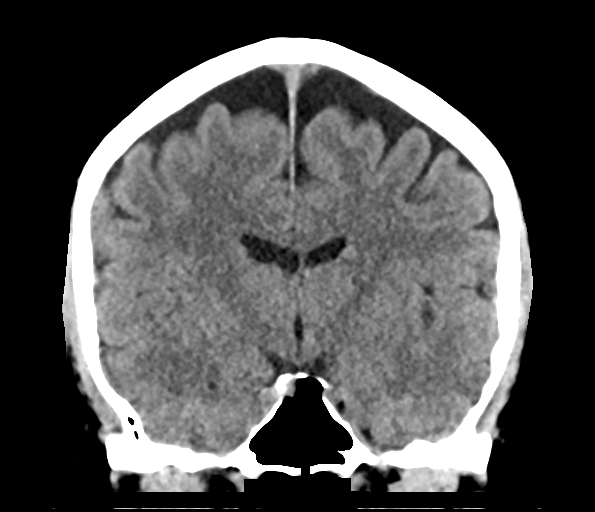

[Series 6: sag soft · sagittal · 0.34mm/px · 3 of 67 slices shown]
[im 23/67  brain]
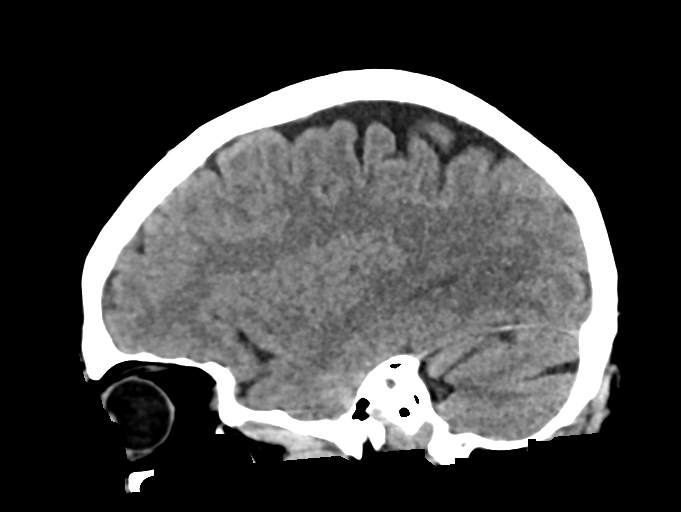
[im 34/67  brain]
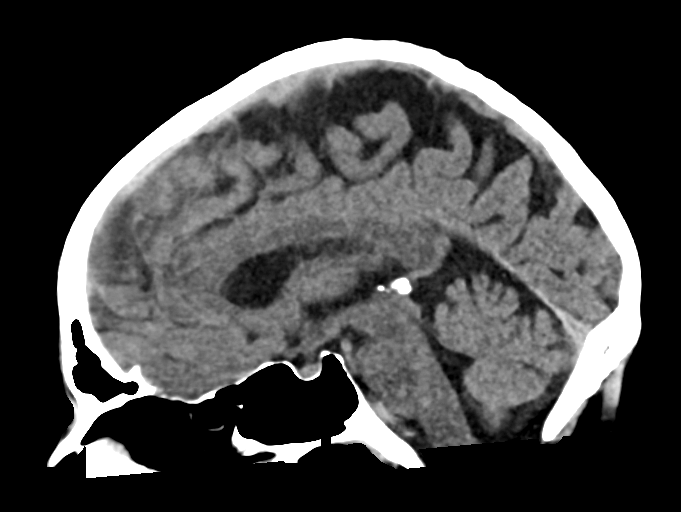
[im 45/67  brain]
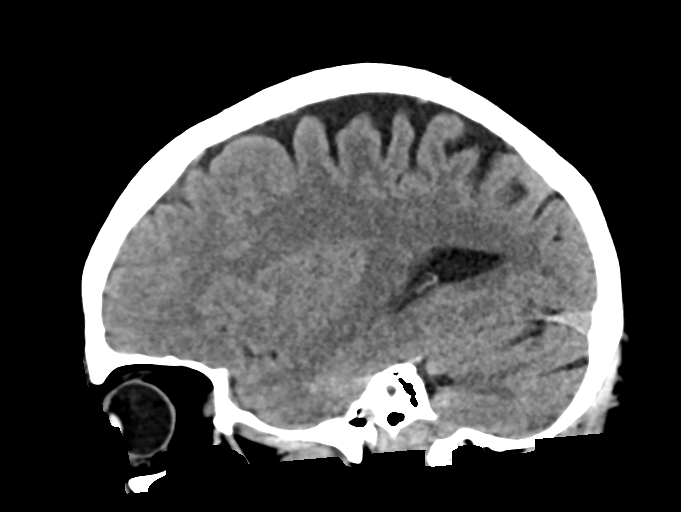

[16 of 47 positions shown; findings below may reference images not displayed]

FINDINGS: Brain: No evidence of acute infarction, hemorrhage, hydrocephalus,
extra-axial collection or mass lesion/mass effect. Mild generalized
cerebral atrophy, slightly greater than expected for age.

Vascular: No hyperdense vessel or unexpected calcification.

Skull: Normal. Negative for fracture or focal lesion.

Sinuses/Orbits: No acute finding.

Other: None.
IMPRESSION: 1. No acute intracranial abnormality. Mild generalized cerebral
atrophy, slightly greater than expected for age.

## 2018-08-17 ENCOUNTER — Encounter (INDEPENDENT_AMBULATORY_CARE_PROVIDER_SITE_OTHER): Payer: Self-pay

## 2021-11-10 ENCOUNTER — Encounter: Payer: Self-pay | Admitting: Gastroenterology

## 2021-11-25 ENCOUNTER — Ambulatory Visit (AMBULATORY_SURGERY_CENTER): Payer: 59

## 2021-11-25 VITALS — Ht 74.0 in | Wt 200.0 lb

## 2021-11-25 DIAGNOSIS — Z1211 Encounter for screening for malignant neoplasm of colon: Secondary | ICD-10-CM

## 2021-11-25 MED ORDER — NA SULFATE-K SULFATE-MG SULF 17.5-3.13-1.6 GM/177ML PO SOLN: 1.0000 | Freq: Once | ORAL | 0 refills | Status: AC

## 2021-11-25 NOTE — Progress Notes (Signed)

## 2021-12-09 ENCOUNTER — Encounter: Payer: Self-pay | Admitting: Gastroenterology

## 2021-12-18 ENCOUNTER — Encounter: Payer: Self-pay | Admitting: Gastroenterology

## 2021-12-18 ENCOUNTER — Ambulatory Visit (AMBULATORY_SURGERY_CENTER): Payer: 59 | Admitting: Gastroenterology

## 2021-12-18 VITALS — BP 125/81 | HR 54 | Temp 97.7°F | Resp 13 | Ht 74.0 in | Wt 200.0 lb

## 2021-12-18 DIAGNOSIS — K529 Noninfective gastroenteritis and colitis, unspecified: Secondary | ICD-10-CM | POA: Diagnosis not present

## 2021-12-18 DIAGNOSIS — D125 Benign neoplasm of sigmoid colon: Secondary | ICD-10-CM

## 2021-12-18 DIAGNOSIS — Z1211 Encounter for screening for malignant neoplasm of colon: Secondary | ICD-10-CM

## 2021-12-18 DIAGNOSIS — K633 Ulcer of intestine: Secondary | ICD-10-CM | POA: Diagnosis not present

## 2021-12-18 DIAGNOSIS — D122 Benign neoplasm of ascending colon: Secondary | ICD-10-CM

## 2021-12-18 MED ORDER — SODIUM CHLORIDE 0.9 % IV SOLN: 500.0000 mL | Freq: Once | INTRAVENOUS | Status: AC

## 2021-12-18 NOTE — Patient Instructions (Signed)
Impression/Recommendations:  Polyp, diverticulosis, and hemorrhoid handouts given to patient.  Resume previous diet. Continue present medications. Await pathology results.  YOU HAD AN ENDOSCOPIC PROCEDURE TODAY AT THE [facility]:   Refer to the procedure report that was given to you for any specific questions about what was found during the examination.  If the procedure report does not answer your questions, please call your gastroenterologist to clarify.  If you requested that your care partner not be given the details of your procedure findings, then the procedure report has been included in a sealed envelope for you to review at your convenience later.  YOU SHOULD EXPECT: Some feelings of bloating in the abdomen. Passage of more gas than usual.  Walking can help get rid of the air that was put into your GI tract during the procedure and reduce the bloating. If you had a lower endoscopy (such as a colonoscopy or flexible sigmoidoscopy) you may notice spotting of blood in your stool or on the toilet paper. If you underwent a bowel prep for your procedure, you may not have a normal bowel movement for a few days.  Please Note:  You might notice some irritation and congestion in your nose or some drainage.  This is from the oxygen used during your procedure.  There is no need for concern and it should clear up in a day or so.  SYMPTOMS TO REPORT IMMEDIATELY:  Following lower endoscopy (colonoscopy or flexible sigmoidoscopy):  Excessive amounts of blood in the stool  Significant tenderness or worsening of abdominal pains  Swelling of the abdomen that is new, acute  Fever of 100F or higher For urgent or emergent issues, a gastroenterologist can be reached at any hour by calling [phone]. Do not use MyChart messaging for urgent concerns.    DIET:  We do recommend a small meal at first, but then you may proceed to your regular diet.  Drink plenty of fluids but you should  avoid alcoholic beverages for 24 hours.  ACTIVITY:  You should plan to take it easy for the rest of today and you should NOT DRIVE or use heavy machinery until tomorrow (because of the sedation medicines used during the test).    FOLLOW UP: Our staff will call the number listed on your records the next business day following your procedure.  We will call around 7:15- 8:00 am to check on you and address any questions or concerns that you may have regarding the information given to you following your procedure. If we do not reach you, we will leave a message.     If any biopsies were taken you will be contacted by phone or by letter within the next 1-3 weeks.  Please call us at [phone] if you have not heard about the biopsies in 3 weeks.    SIGNATURES/CONFIDENTIALITY: You and/or your care partner have signed paperwork which will be entered into your electronic medical record.  These signatures attest to the fact that that the information above on your After Visit Summary has been reviewed and is understood.  Full responsibility of the confidentiality of this discharge information lies with you and/or your care-partner.  

## 2021-12-18 NOTE — Op Note (Signed)
[facility] Patient Name: Phillip Choi Procedure Date: 12/18/2021 10:42 AM MRN: [id number] Endoscopist: Remo Lipps P. Havery Moros , MD, [id number] Age: 51 Referring MD:  Date of Birth: 10-04-1970 Gender: Male Account #: [id number] Procedure:                Colonoscopy Indications:              Screening for colorectal malignant neoplasm, This                            is the patient's first colonoscopy Medicines:                Monitored Anesthesia Care Procedure:                Pre-Anesthesia Assessment:                           - Prior to the procedure, a History and Physical                            was performed, and patient medications and                            allergies were reviewed. The patient's tolerance of                            previous anesthesia was also reviewed. The risks                            and benefits of the procedure and the sedation                            options and risks were discussed with the patient.                            All questions were answered, and informed consent                            was obtained. Prior Anticoagulants: The patient has                            taken no anticoagulant or antiplatelet agents. ASA                            Grade Assessment: I - A normal, healthy patient.                            After reviewing the risks and benefits, the patient                            was deemed in satisfactory condition to undergo the                            procedure.  After obtaining informed consent, the colonoscope                            was passed under direct vision. Throughout the                            procedure, the patient's blood pressure, pulse, and                            oxygen saturations were monitored continuously. The                            CF [id number] #[id number] was introduced through the anus                            and advanced to the the  terminal ileum, with                            identification of the appendiceal orifice and IC                            valve. The colonoscopy was performed without                            difficulty. The patient tolerated the procedure                            well. The quality of the bowel preparation was                            good. The terminal ileum, ileocecal valve,                            appendiceal orifice, and rectum were photographed. Scope In: 10:50:34 AM Scope Out: 11:09:04 AM Scope Withdrawal Time: 0 hours 16 minutes 2 seconds  Total Procedure Duration: 0 hours 18 minutes 30 seconds  Findings:                 The perianal and digital rectal examinations were                            normal.                           The terminal ileum contained a few small ulcers.                            Biopsies were taken with a cold forceps for                            histology. The rest of the ileum was normal.                           Multiple small-mouthed diverticula were found in  the cecum and left colon.                           A 4 mm polyp was found in the ascending colon. The                            polyp was sessile. The polyp was removed with a                            cold snare. Resection and retrieval were complete.                           A 4 mm polyp was found in the sigmoid colon. The                            polyp was sessile. The polyp was removed with a                            cold snare. Resection and retrieval were complete.                           Internal hemorrhoids were found during retroflexion.                           The exam was otherwise without abnormality. Complications:            No immediate complications. Estimated blood loss:                            Minimal. Estimated Blood Loss:     Estimated blood loss was minimal. Impression:               - A few small benign appearing ulcers  in the                            terminal ileum. Nonspecific. Biopsied.                           - Diverticulosis in the cecum and in the left colon.                           - One 4 mm polyp in the ascending colon, removed                            with a cold snare. Resected and retrieved.                           - One 4 mm polyp in the sigmoid colon, removed with                            a cold snare. Resected and retrieved.                           -  Internal hemorrhoids.                           - The examination was otherwise normal. Recommendation:           - Patient has a contact number available for                            emergencies. The signs and symptoms of potential                            delayed complications were discussed with the                            patient. Return to normal activities tomorrow.                            Written discharge instructions were provided to the                            patient.                           - Resume previous diet.                           - Continue present medications.                           - Await pathology results. Remo Lipps P. Turquoise Esch, MD 12/18/2021 11:13:44 AM This report has been signed electronically.

## 2021-12-18 NOTE — Progress Notes (Signed)
[facility] History and Physical   Primary Care Physician:  Estil Daft, MD   Reason for Procedure:   Colon cancer screening  Plan:    colonoscopy     HPI: Phillip Choi is a 51 y.o. male  here for colonoscopy screening - first exam. 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 Patient denies any bowel symptoms at this time. No family history of colon cancer known. Otherwise feels well without any cardiopulmonary symptoms.   I have discussed risks / benefits of anesthesia and endoscopic procedure with Telford Nab and they wish to proceed with the exams as outlined today.    Past Medical History:  Diagnosis Date   Allergy    GERD (gastroesophageal reflux disease)    Heart murmur    Renal disorder    hx of kidney stone    Past Surgical History:  Procedure Laterality Date   COLON RESECTION  2015   gum graft     Feb 2014   LAPAROSCOPY N/A 03/17/2012   Procedure: LAPAROSCOPY DIAGNOSTIC;  Surgeon: Pedro Earls, MD;  Location: [facility];  Service: General;  Laterality: N/A;  intrabdominal lysis   Ureteroscopy with stone removal  02/09/2004   Dr. Serita Butcher: [facility]    Prior to Admission medications   Medication Sig Start Date End Date Taking? Authorizing Provider  acetaminophen (TYLENOL) 325 MG tablet Take 650 mg by mouth every 6 (six) hours as needed.   Yes [provider]  fexofenadine (ALLEGRA) 180 MG tablet Take 180 mg by mouth daily.   Yes [provider]  benzonatate (TESSALON PERLES) 100 MG capsule Take 1 capsule (100 mg total) by mouth 3 (three) times daily as needed. Patient not taking: Reported on 11/25/2021 08/06/18   Evelina Dun A, FNP  ibuprofen (ADVIL,MOTRIN) 200 MG tablet Take 200 mg by mouth every 6 (six) hours as needed for headache. Patient not taking: Reported on 11/25/2021    [provider]    Current Outpatient Medications  Medication Sig Dispense Refill    acetaminophen (TYLENOL) 325 MG tablet Take 650 mg by mouth every 6 (six) hours as needed.     fexofenadine (ALLEGRA) 180 MG tablet Take 180 mg by mouth daily.     benzonatate (TESSALON PERLES) 100 MG capsule Take 1 capsule (100 mg total) by mouth 3 (three) times daily as needed. (Patient not taking: Reported on 11/25/2021) 20 capsule 0   ibuprofen (ADVIL,MOTRIN) 200 MG tablet Take 200 mg by mouth every 6 (six) hours as needed for headache. (Patient not taking: Reported on 11/25/2021)     Current Facility-Administered Medications  Medication Dose Route Frequency Provider Last Rate Last Admin   0.9 %  sodium chloride infusion  500 mL Intravenous Once Elmor Kost, Carlota Raspberry, MD        Allergies as of 12/18/2021   (No Known Allergies)    Family History  Problem Relation Age of Onset   Colon polyps Mother    Colon cancer Neg Hx    Esophageal cancer Neg Hx    Rectal cancer Neg Hx    Stomach cancer Neg Hx     Social History   Socioeconomic History   Marital status: Married    Spouse name: Not on file   Number of children: Not on file   Years of education: Not on file   Highest education level: Not on file  Occupational History   Occupation: Engineer, maintenance (IT)    Employer: [employer]  Tobacco  Use   Smoking status: Never   Smokeless tobacco: Never  Substance and Sexual Activity   Alcohol use: Yes    Comment: 12 drinks/ week   Drug use: No   Sexual activity: Not on file  Other Topics Concern   Not on file  Social History Narrative   Avid runner.  Has done many half marathons.   Social Determinants of Health   Financial Resource Strain: Not on file  Food Insecurity: Not on file  Transportation Needs: Not on file  Physical Activity: Not on file  Stress: Not on file  Social Connections: Not on file  Intimate Partner Violence: Not on file    Review of Systems: All other review of systems negative except as mentioned in the HPI.  Physical Exam: Vital signs BP (!) 152/88   Pulse 75    Temp 97.7 F (36.5 C)   Ht '6\' 2"'$  (1.88 m)   Wt 200 lb (90.7 kg)   SpO2 96%   BMI 25.68 kg/m   General:   Alert,  Well-developed, pleasant and cooperative in NAD Lungs:  Clear throughout to auscultation.   Heart:  Regular rate and rhythm Abdomen:  Soft, nontender and nondistended.   Neuro/Psych:  Alert and cooperative. Normal mood and affect. A and O x 3  Jolly Mango, MD [facility]

## 2021-12-18 NOTE — Progress Notes (Signed)
Called to room to assist during endoscopic procedure.  Patient ID and intended procedure confirmed with present staff. Received instructions for my participation in the procedure from the performing physician.  

## 2021-12-18 NOTE — Progress Notes (Signed)
Report to PACU, RN, vss, BBS= Clear.  

## 2021-12-21 ENCOUNTER — Telehealth: Payer: Self-pay

## 2021-12-21 NOTE — Telephone Encounter (Signed)
No anwer, left HIPAA compliant voicemail

## 2022-01-14 ENCOUNTER — Other Ambulatory Visit (INDEPENDENT_AMBULATORY_CARE_PROVIDER_SITE_OTHER): Payer: 59

## 2022-01-14 ENCOUNTER — Encounter: Payer: Self-pay | Admitting: Gastroenterology

## 2022-01-14 ENCOUNTER — Ambulatory Visit: Payer: 59 | Admitting: Gastroenterology

## 2022-01-14 VITALS — BP 124/80 | HR 77 | Ht 74.0 in | Wt 207.0 lb

## 2022-01-14 DIAGNOSIS — K529 Noninfective gastroenteritis and colitis, unspecified: Secondary | ICD-10-CM | POA: Diagnosis not present

## 2022-01-14 DIAGNOSIS — R194 Change in bowel habit: Secondary | ICD-10-CM | POA: Diagnosis not present

## 2022-01-14 DIAGNOSIS — Z86010 Personal history of colonic polyps: Secondary | ICD-10-CM

## 2022-01-14 LAB — CBC WITH DIFFERENTIAL/PLATELET
Basophils Absolute: 0.1 10*3/uL (ref 0.0–0.1)
Basophils Relative: 1.2 % (ref 0.0–3.0)
Eosinophils Absolute: 0.2 10*3/uL (ref 0.0–0.7)
Eosinophils Relative: 3.5 % (ref 0.0–5.0)
HCT: 41 % (ref 39.0–52.0)
Hemoglobin: 14.2 g/dL (ref 13.0–17.0)
Lymphocytes Relative: 36.6 % (ref 12.0–46.0)
Lymphs Abs: 2 10*3/uL (ref 0.7–4.0)
MCHC: 34.8 g/dL (ref 30.0–36.0)
MCV: 93.2 fl (ref 78.0–100.0)
Monocytes Absolute: 0.5 10*3/uL (ref 0.1–1.0)
Monocytes Relative: 8.6 % (ref 3.0–12.0)
Neutro Abs: 2.7 10*3/uL (ref 1.4–7.7)
Neutrophils Relative %: 50.1 % (ref 43.0–77.0)
Platelets: 283 10*3/uL (ref 150.0–400.0)
RBC: 4.4 Mil/uL (ref 4.22–5.81)
RDW: 12.3 % (ref 11.5–15.5)
WBC: 5.4 10*3/uL (ref 4.0–10.5)

## 2022-01-14 LAB — IBC + FERRITIN
Ferritin: 45.5 ng/mL (ref 22.0–322.0)
Iron: 99 ug/dL (ref 42–165)
Saturation Ratios: 23.6 % (ref 20.0–50.0)
TIBC: 420 ug/dL (ref 250.0–450.0)
Transferrin: 300 mg/dL (ref 212.0–360.0)

## 2022-01-14 LAB — VITAMIN B12: Vitamin B-12: 267 pg/mL (ref 211–911)

## 2022-01-14 LAB — C-REACTIVE PROTEIN: CRP: <1 mg/dL (ref 0.5–20.0)

## 2022-01-14 NOTE — Progress Notes (Signed)
HPI :  51 year old male here for a follow-up visit to discuss recent colonoscopy results, ileitis.  He presented for routine screening colonoscopy on November 10, 2 small adenomas removed.  Incidentally noted he had a few ulcerations in his terminal ileum.  Biopsies showed ileitis with chronicity, raising question of underlying Crohn's disease.  He states he has had some "stomach issues" his whole life.  Several years ago he was having intermittent episodes of obstruction with severe nausea vomiting and pain.  Ultimately found to have a bowel obstruction on 1 evaluation, led to exploratory laparotomy and he had a Meckel's diverticulum removed at [facility] back in 2015 or so.  He states that he is resolved his obstructive symptoms.  Unable to review pathology results from that operation in care everywhere and there was clear evidence of a Meckel's diverticulum on that specimen, no evidence of Crohn's disease.  He has not really had any significant pains like he had previously since that operation.  He does have some occasional loose stools after eating although hard to pinpoint clear triggers of which foods cause this.  He has occasional diarrhea intermittently at baseline.  He has some occasional bloating and distention of his abdomen.  No blood in his stools at baseline.  No focal abdominal pains.  Weight stable.  He denies any family history of IBD.  Grandmother had colon cancer in her 22s but no other strong family history of colon cancer or celiac disease etc.  He denies feeling ill or having any infectious symptoms at the time of his last colonoscopy.  He denies any routine use of NSAIDs.  He only takes Allegra at baseline, no other medications routinely.   Colonoscopy 12/18/2021: - The perianal and digital rectal examinations were normal. - The terminal ileum contained a few small ulcers. Biopsies were taken with a cold forceps for histology. The rest of the ileum was normal. - Multiple  small-mouthed diverticula were found in the cecum and left colon. - A 4 mm polyp was found in the ascending colon. The polyp was sessile. The polyp was removed with a cold snare. Resection and retrieval were complete. - A 4 mm polyp was found in the sigmoid colon. The polyp was sessile. The polyp was removed with a cold snare. Resection and retrieval were complete. - Internal hemorrhoids were found during retroflexion. - The exam was otherwise without abnormality.  1. Surgical [P], small bowel - ACUTE ILEITIS WITH FEATURES OF CHRONICITY (SEE NOTE ). - NEGATIVE FOR GRANULOMATA OR DYSPLASIA. 2. Surgical [P], colon, ascending, polyp (1) - TUBULAR ADENOMA. - NO HIGH GRADE DYSPLASIA OR MALIGNANCY. 3. Surgical [P], colon, sigmoid, polyp (1) - TUBULAR ADENOMA. - NO HIGH GRADE DYSPLASIA OR MALIGNANCY. 1. Sections of the biopsy show small bowel mucosa with focal acute inflammation including cryptitis, crypt abscesses and ulceration. Focal architectural changes including villous blunting and crypt loss are present. The differential diagnosis includes drug effect and inflammatory bowel disease amongst others. Clinical correlation recommended.  Repeat colonoscopy in 7 years for polyp surveillance   Past Medical History:  Diagnosis Date   Allergy    GERD (gastroesophageal reflux disease)    Heart murmur    Renal disorder    hx of kidney stone     Past Surgical History:  Procedure Laterality Date   COLON RESECTION  2015   gum graft     Feb 2014   LAPAROSCOPY N/A 03/17/2012   Procedure: LAPAROSCOPY DIAGNOSTIC;  Surgeon: Pedro Earls, MD;  Location:  [facility];  Service: General;  Laterality: N/A;  intrabdominal lysis   Ureteroscopy with stone removal  02/09/2004   Dr. Serita Butcher: [facility]   Family History  Problem Relation Age of Onset   Colon polyps Mother    Colon cancer Neg Hx    Esophageal cancer Neg Hx    Rectal cancer Neg Hx    Stomach cancer Neg Hx    Social History    Tobacco Use   Smoking status: Never   Smokeless tobacco: Never  Vaping Use   Vaping Use: Never used  Substance Use Topics   Alcohol use: Yes    Comment: 12 drinks/ week   Drug use: No   Current Outpatient Medications  Medication Sig Dispense Refill   acetaminophen (TYLENOL) 325 MG tablet Take 650 mg by mouth every 6 (six) hours as needed.     fexofenadine (ALLEGRA) 180 MG tablet Take 180 mg by mouth daily.     ibuprofen (ADVIL,MOTRIN) 200 MG tablet Take 200 mg by mouth every 6 (six) hours as needed for headache.     Current Facility-Administered Medications  Medication Dose Route Frequency Provider Last Rate Last Admin   0.9 %  sodium chloride infusion  500 mL Intravenous Once Baruch Lewers, Carlota Raspberry, MD       No Known Allergies   Review of Systems: All systems reviewed and negative except where noted in HPI.   Labs reviewed in Care everywhere  Physical Exam: BP 124/80   Pulse 77   Ht '6\' 2"'$  (1.88 m)   Wt 207 lb (93.9 kg)   BMI 26.58 kg/m  Constitutional: Pleasant,well-developed, male in no acute distress. Abdominal: Soft, nondistended, nontender.  There are no masses palpable. No hepatomegaly. Neurological: Alert and oriented to person place and time. Psychiatric: Normal mood and affect. Behavior is normal.   ASSESSMENT: 51 y.o. male here for assessment of the following  1. Ileitis   2. Altered bowel habits   3. History of colon polyps    As above, history of Meckel's diverticulum removed surgically in 2015, that was causing intermittent obstructive symptoms at the time.  Surgical specimen I was able to review showed no evidence of Crohn's disease.  Since then he has had intermittent loose stools with some bloating but no significant symptoms or hospitalizations etc.  On recent screening colonoscopy noted to have some small ulcerations in his ileum with biopsies showing chronic ileitis.  We discussed differential diagnosis for this finding, which may represent some  very mild Crohn's disease.  He does not take any NSAIDs routinely. We discussed what Crohn's disease is, how we diagnose this, potential complications of this if he had it, and when treatment is indicated etc.  Again I am not convinced he has Crohn's disease, and if he does it is quite mild and may not warrant any therapy; however, I do think further evaluation is warranted to further evaluate this in light of his symptoms.  Is also possible he has some underlying IBS, and discussed how IBD can increase the risk for IBS.  At this time, I would like to obtain a fecal calprotectin to assess for active inflammation of his bowel.  Will also check CBC, iron studies, B12, CRP, and screen for celiac disease in light of his symptoms.  If he has some active inflammation or vitamin deficiencies, we may consider pursuing an MR enterography study or capsule study to clarify extent of inflammation, and if this is still present.  We discussed that if  he has Crohn's disease again it would seem to be quite mild, and if it would even warrant therapy versus just close monitoring given risks, cost, inconvenience of therapy.  If his stool and blood work results are normal, would likely monitor for now or consider further evaluation pending how much his symptoms bother him, as they seem quite mild and stable over time.  Otherwise reviewed some options to help with the symptoms in the interim, counseled him on a low FODMAP diet handouts provided, will see if that helps for now.  Moving forward if he has any significant abdominal pains, obstructive symptoms, worsening diarrhea, rectal bleeding etc. he needs to contact us.   PLAN: - stool for fecal calprotectin - labs - CBC, TIBC / ferritin panel, B12, CRP, TTG IgA, IgA level - trial of low FODMAP diet - handout provided - consideration for MRE or capsule study pending course and results of his evaluation - for the colon polyps, repeat colonoscopy in 7 years for  surveillance  Jolly Mango, MD [facility]

## 2022-01-14 NOTE — Patient Instructions (Signed)
Your provider has requested that you go to the basement level for lab work before leaving today. Press "B" on the elevator. The lab is located at the first door on the left as you exit the elevator.  You have been given a low-fodmap diet to follow.   The [facility] providers would like to encourage you to use [facility] to communicate with providers for non-urgent requests or questions.  Due to long hold times on the telephone, sending your provider a message by [facility] may be a faster and more efficient way to get a response.  Please allow 48 business hours for a response.  Please remember that this is for non-urgent requests.   Due to recent changes in healthcare laws, you may see the results of your imaging and laboratory studies on MyChart before your provider has had a chance to review them.  We understand that in some cases there may be results that are confusing or concerning to you. Not all laboratory results come back in the same time frame and the provider may be waiting for multiple results in order to interpret others.  Please give [place] 48 hours in order for your provider to thoroughly review all the results before contacting the office for clarification of your results.   Thank you for entrusting me with your care and for choosing [facility], Dr. [place] Cellar

## 2022-01-15 LAB — IGA: Immunoglobulin A: 394 mg/dL — ABNORMAL HIGH (ref 47–310)

## 2022-01-15 LAB — TISSUE TRANSGLUTAMINASE, IGA: (tTG) Ab, IgA: <1 U/mL

## 2022-09-06 ENCOUNTER — Other Ambulatory Visit: Payer: 59

## 2022-09-06 DIAGNOSIS — K529 Noninfective gastroenteritis and colitis, unspecified: Secondary | ICD-10-CM

## 2022-09-09 ENCOUNTER — Encounter: Payer: Self-pay | Admitting: Gastroenterology

## 2022-09-10 NOTE — Telephone Encounter (Signed)
Phillip Choi can you put a recall in for fecal calprotectin in 3 to 6 months.  I will send him a MyChart message.  Thanks

## 2022-09-10 NOTE — Telephone Encounter (Signed)
24-month fecal calprotectin reminder in epic.
